# Patient Record
Sex: Male | Born: 1940 | Race: White | Hispanic: No | Marital: Married | Smoking: Never smoker
Health system: Southern US, Community
[De-identification: ages and names within clinical notes are randomized; demographics above are authoritative.]

## PROBLEM LIST (undated history)

## (undated) DIAGNOSIS — D759 Disease of blood and blood-forming organs, unspecified: Secondary | ICD-10-CM

## (undated) DIAGNOSIS — J189 Pneumonia, unspecified organism: Secondary | ICD-10-CM

## (undated) DIAGNOSIS — M1990 Unspecified osteoarthritis, unspecified site: Secondary | ICD-10-CM

## (undated) DIAGNOSIS — S82209A Unspecified fracture of shaft of unspecified tibia, initial encounter for closed fracture: Secondary | ICD-10-CM

## (undated) DIAGNOSIS — I10 Essential (primary) hypertension: Secondary | ICD-10-CM

## (undated) DIAGNOSIS — I82409 Acute embolism and thrombosis of unspecified deep veins of unspecified lower extremity: Secondary | ICD-10-CM

## (undated) DIAGNOSIS — IMO0001 Reserved for inherently not codable concepts without codable children: Secondary | ICD-10-CM

## (undated) DIAGNOSIS — I739 Peripheral vascular disease, unspecified: Secondary | ICD-10-CM

## (undated) DIAGNOSIS — E039 Hypothyroidism, unspecified: Secondary | ICD-10-CM

## (undated) HISTORY — PX: CORONARY ARTERY BYPASS GRAFT: SHX141

## (undated) HISTORY — PX: HYDROCELE EXCISION: SHX482

## (undated) HISTORY — PX: OTHER SURGICAL HISTORY: SHX169

---

## 1962-01-17 HISTORY — PX: OTHER SURGICAL HISTORY: SHX169

## 2009-04-27 ENCOUNTER — Ambulatory Visit: Payer: Self-pay | Admitting: Cardiology

## 2011-02-01 DIAGNOSIS — I82409 Acute embolism and thrombosis of unspecified deep veins of unspecified lower extremity: Secondary | ICD-10-CM | POA: Diagnosis not present

## 2011-02-22 DIAGNOSIS — E785 Hyperlipidemia, unspecified: Secondary | ICD-10-CM | POA: Diagnosis not present

## 2011-02-22 DIAGNOSIS — I10 Essential (primary) hypertension: Secondary | ICD-10-CM | POA: Diagnosis not present

## 2011-02-22 DIAGNOSIS — N400 Enlarged prostate without lower urinary tract symptoms: Secondary | ICD-10-CM | POA: Diagnosis not present

## 2011-03-01 DIAGNOSIS — M1990 Unspecified osteoarthritis, unspecified site: Secondary | ICD-10-CM | POA: Diagnosis not present

## 2011-03-01 DIAGNOSIS — I82409 Acute embolism and thrombosis of unspecified deep veins of unspecified lower extremity: Secondary | ICD-10-CM | POA: Diagnosis not present

## 2011-03-01 DIAGNOSIS — Z0000 Encounter for general adult medical examination without abnormal findings: Secondary | ICD-10-CM | POA: Diagnosis not present

## 2011-03-01 DIAGNOSIS — D510 Vitamin B12 deficiency anemia due to intrinsic factor deficiency: Secondary | ICD-10-CM | POA: Diagnosis not present

## 2011-03-01 DIAGNOSIS — I10 Essential (primary) hypertension: Secondary | ICD-10-CM | POA: Diagnosis not present

## 2011-03-01 DIAGNOSIS — E669 Obesity, unspecified: Secondary | ICD-10-CM | POA: Diagnosis not present

## 2011-03-01 DIAGNOSIS — Z23 Encounter for immunization: Secondary | ICD-10-CM | POA: Diagnosis not present

## 2011-03-01 DIAGNOSIS — G622 Polyneuropathy due to other toxic agents: Secondary | ICD-10-CM | POA: Diagnosis not present

## 2011-03-01 DIAGNOSIS — R5381 Other malaise: Secondary | ICD-10-CM | POA: Diagnosis not present

## 2011-03-01 DIAGNOSIS — E782 Mixed hyperlipidemia: Secondary | ICD-10-CM | POA: Diagnosis not present

## 2011-03-29 DIAGNOSIS — I82409 Acute embolism and thrombosis of unspecified deep veins of unspecified lower extremity: Secondary | ICD-10-CM | POA: Diagnosis not present

## 2011-04-22 DIAGNOSIS — E039 Hypothyroidism, unspecified: Secondary | ICD-10-CM | POA: Diagnosis not present

## 2011-04-29 DIAGNOSIS — I82409 Acute embolism and thrombosis of unspecified deep veins of unspecified lower extremity: Secondary | ICD-10-CM | POA: Diagnosis not present

## 2011-06-15 DIAGNOSIS — I82409 Acute embolism and thrombosis of unspecified deep veins of unspecified lower extremity: Secondary | ICD-10-CM | POA: Diagnosis not present

## 2011-06-15 DIAGNOSIS — E039 Hypothyroidism, unspecified: Secondary | ICD-10-CM | POA: Diagnosis not present

## 2011-07-14 DIAGNOSIS — I82409 Acute embolism and thrombosis of unspecified deep veins of unspecified lower extremity: Secondary | ICD-10-CM | POA: Diagnosis not present

## 2011-08-10 DIAGNOSIS — I82409 Acute embolism and thrombosis of unspecified deep veins of unspecified lower extremity: Secondary | ICD-10-CM | POA: Diagnosis not present

## 2011-08-25 DIAGNOSIS — H2510 Age-related nuclear cataract, unspecified eye: Secondary | ICD-10-CM | POA: Diagnosis not present

## 2011-08-25 DIAGNOSIS — I10 Essential (primary) hypertension: Secondary | ICD-10-CM | POA: Diagnosis not present

## 2011-08-25 DIAGNOSIS — E039 Hypothyroidism, unspecified: Secondary | ICD-10-CM | POA: Diagnosis not present

## 2011-08-25 DIAGNOSIS — E782 Mixed hyperlipidemia: Secondary | ICD-10-CM | POA: Diagnosis not present

## 2011-09-01 DIAGNOSIS — E039 Hypothyroidism, unspecified: Secondary | ICD-10-CM | POA: Diagnosis not present

## 2011-09-01 DIAGNOSIS — M1990 Unspecified osteoarthritis, unspecified site: Secondary | ICD-10-CM | POA: Diagnosis not present

## 2011-09-01 DIAGNOSIS — G619 Inflammatory polyneuropathy, unspecified: Secondary | ICD-10-CM | POA: Diagnosis not present

## 2011-09-01 DIAGNOSIS — G622 Polyneuropathy due to other toxic agents: Secondary | ICD-10-CM | POA: Diagnosis not present

## 2011-09-01 DIAGNOSIS — I82409 Acute embolism and thrombosis of unspecified deep veins of unspecified lower extremity: Secondary | ICD-10-CM | POA: Diagnosis not present

## 2011-09-01 DIAGNOSIS — IMO0001 Reserved for inherently not codable concepts without codable children: Secondary | ICD-10-CM | POA: Diagnosis not present

## 2011-09-01 DIAGNOSIS — E669 Obesity, unspecified: Secondary | ICD-10-CM | POA: Diagnosis not present

## 2011-09-01 DIAGNOSIS — E782 Mixed hyperlipidemia: Secondary | ICD-10-CM | POA: Diagnosis not present

## 2011-09-01 DIAGNOSIS — I10 Essential (primary) hypertension: Secondary | ICD-10-CM | POA: Diagnosis not present

## 2011-09-14 DIAGNOSIS — I82409 Acute embolism and thrombosis of unspecified deep veins of unspecified lower extremity: Secondary | ICD-10-CM | POA: Diagnosis not present

## 2011-10-12 DIAGNOSIS — Z23 Encounter for immunization: Secondary | ICD-10-CM | POA: Diagnosis not present

## 2011-10-26 DIAGNOSIS — I82409 Acute embolism and thrombosis of unspecified deep veins of unspecified lower extremity: Secondary | ICD-10-CM | POA: Diagnosis not present

## 2011-12-07 DIAGNOSIS — I82409 Acute embolism and thrombosis of unspecified deep veins of unspecified lower extremity: Secondary | ICD-10-CM | POA: Diagnosis not present

## 2012-01-20 DIAGNOSIS — I82409 Acute embolism and thrombosis of unspecified deep veins of unspecified lower extremity: Secondary | ICD-10-CM | POA: Diagnosis not present

## 2012-03-07 DIAGNOSIS — N400 Enlarged prostate without lower urinary tract symptoms: Secondary | ICD-10-CM | POA: Diagnosis not present

## 2012-03-07 DIAGNOSIS — E782 Mixed hyperlipidemia: Secondary | ICD-10-CM | POA: Diagnosis not present

## 2012-03-14 DIAGNOSIS — Z0000 Encounter for general adult medical examination without abnormal findings: Secondary | ICD-10-CM | POA: Diagnosis not present

## 2012-03-14 DIAGNOSIS — I10 Essential (primary) hypertension: Secondary | ICD-10-CM | POA: Diagnosis not present

## 2012-04-20 DIAGNOSIS — I82409 Acute embolism and thrombosis of unspecified deep veins of unspecified lower extremity: Secondary | ICD-10-CM | POA: Diagnosis not present

## 2012-05-28 DIAGNOSIS — I82409 Acute embolism and thrombosis of unspecified deep veins of unspecified lower extremity: Secondary | ICD-10-CM | POA: Diagnosis not present

## 2012-07-06 DIAGNOSIS — I82409 Acute embolism and thrombosis of unspecified deep veins of unspecified lower extremity: Secondary | ICD-10-CM | POA: Diagnosis not present

## 2012-07-23 DIAGNOSIS — I10 Essential (primary) hypertension: Secondary | ICD-10-CM | POA: Diagnosis not present

## 2012-07-23 DIAGNOSIS — M1990 Unspecified osteoarthritis, unspecified site: Secondary | ICD-10-CM | POA: Diagnosis not present

## 2012-07-23 DIAGNOSIS — IMO0001 Reserved for inherently not codable concepts without codable children: Secondary | ICD-10-CM | POA: Diagnosis not present

## 2012-07-23 DIAGNOSIS — E782 Mixed hyperlipidemia: Secondary | ICD-10-CM | POA: Diagnosis not present

## 2012-07-23 DIAGNOSIS — E039 Hypothyroidism, unspecified: Secondary | ICD-10-CM | POA: Diagnosis not present

## 2012-07-23 DIAGNOSIS — G619 Inflammatory polyneuropathy, unspecified: Secondary | ICD-10-CM | POA: Diagnosis not present

## 2012-07-23 DIAGNOSIS — G622 Polyneuropathy due to other toxic agents: Secondary | ICD-10-CM | POA: Diagnosis not present

## 2012-08-10 DIAGNOSIS — I82409 Acute embolism and thrombosis of unspecified deep veins of unspecified lower extremity: Secondary | ICD-10-CM | POA: Diagnosis not present

## 2012-09-21 DIAGNOSIS — I82409 Acute embolism and thrombosis of unspecified deep veins of unspecified lower extremity: Secondary | ICD-10-CM | POA: Diagnosis not present

## 2012-09-28 DIAGNOSIS — H2510 Age-related nuclear cataract, unspecified eye: Secondary | ICD-10-CM | POA: Diagnosis not present

## 2012-10-16 DIAGNOSIS — Z23 Encounter for immunization: Secondary | ICD-10-CM | POA: Diagnosis not present

## 2012-10-18 DIAGNOSIS — J019 Acute sinusitis, unspecified: Secondary | ICD-10-CM | POA: Diagnosis not present

## 2012-10-18 DIAGNOSIS — J309 Allergic rhinitis, unspecified: Secondary | ICD-10-CM | POA: Diagnosis not present

## 2012-11-02 DIAGNOSIS — I82409 Acute embolism and thrombosis of unspecified deep veins of unspecified lower extremity: Secondary | ICD-10-CM | POA: Diagnosis not present

## 2012-11-21 DIAGNOSIS — IMO0002 Reserved for concepts with insufficient information to code with codable children: Secondary | ICD-10-CM | POA: Diagnosis not present

## 2012-11-21 DIAGNOSIS — S61209A Unspecified open wound of unspecified finger without damage to nail, initial encounter: Secondary | ICD-10-CM | POA: Diagnosis not present

## 2012-11-21 DIAGNOSIS — S62639B Displaced fracture of distal phalanx of unspecified finger, initial encounter for open fracture: Secondary | ICD-10-CM | POA: Diagnosis not present

## 2012-12-03 DIAGNOSIS — S61209A Unspecified open wound of unspecified finger without damage to nail, initial encounter: Secondary | ICD-10-CM | POA: Diagnosis not present

## 2012-12-10 DIAGNOSIS — S61209A Unspecified open wound of unspecified finger without damage to nail, initial encounter: Secondary | ICD-10-CM | POA: Diagnosis not present

## 2012-12-10 DIAGNOSIS — I82409 Acute embolism and thrombosis of unspecified deep veins of unspecified lower extremity: Secondary | ICD-10-CM | POA: Diagnosis not present

## 2012-12-28 DIAGNOSIS — R05 Cough: Secondary | ICD-10-CM | POA: Diagnosis not present

## 2012-12-28 DIAGNOSIS — J40 Bronchitis, not specified as acute or chronic: Secondary | ICD-10-CM | POA: Diagnosis not present

## 2013-01-08 DIAGNOSIS — I82409 Acute embolism and thrombosis of unspecified deep veins of unspecified lower extremity: Secondary | ICD-10-CM | POA: Diagnosis not present

## 2013-01-08 DIAGNOSIS — J40 Bronchitis, not specified as acute or chronic: Secondary | ICD-10-CM | POA: Diagnosis not present

## 2013-01-08 DIAGNOSIS — R05 Cough: Secondary | ICD-10-CM | POA: Diagnosis not present

## 2013-01-15 DIAGNOSIS — I82409 Acute embolism and thrombosis of unspecified deep veins of unspecified lower extremity: Secondary | ICD-10-CM | POA: Diagnosis not present

## 2013-02-26 DIAGNOSIS — I82409 Acute embolism and thrombosis of unspecified deep veins of unspecified lower extremity: Secondary | ICD-10-CM | POA: Diagnosis not present

## 2013-03-06 DIAGNOSIS — M1990 Unspecified osteoarthritis, unspecified site: Secondary | ICD-10-CM | POA: Diagnosis not present

## 2013-03-06 DIAGNOSIS — I10 Essential (primary) hypertension: Secondary | ICD-10-CM | POA: Diagnosis not present

## 2013-03-06 DIAGNOSIS — E669 Obesity, unspecified: Secondary | ICD-10-CM | POA: Diagnosis not present

## 2013-03-06 DIAGNOSIS — E039 Hypothyroidism, unspecified: Secondary | ICD-10-CM | POA: Diagnosis not present

## 2013-03-06 DIAGNOSIS — IMO0001 Reserved for inherently not codable concepts without codable children: Secondary | ICD-10-CM | POA: Diagnosis not present

## 2013-03-06 DIAGNOSIS — I82409 Acute embolism and thrombosis of unspecified deep veins of unspecified lower extremity: Secondary | ICD-10-CM | POA: Diagnosis not present

## 2013-03-06 DIAGNOSIS — E119 Type 2 diabetes mellitus without complications: Secondary | ICD-10-CM | POA: Diagnosis not present

## 2013-03-06 DIAGNOSIS — E782 Mixed hyperlipidemia: Secondary | ICD-10-CM | POA: Diagnosis not present

## 2013-03-13 DIAGNOSIS — I10 Essential (primary) hypertension: Secondary | ICD-10-CM | POA: Diagnosis not present

## 2013-03-13 DIAGNOSIS — E039 Hypothyroidism, unspecified: Secondary | ICD-10-CM | POA: Diagnosis not present

## 2013-03-13 DIAGNOSIS — E119 Type 2 diabetes mellitus without complications: Secondary | ICD-10-CM | POA: Diagnosis not present

## 2013-03-13 DIAGNOSIS — G622 Polyneuropathy due to other toxic agents: Secondary | ICD-10-CM | POA: Diagnosis not present

## 2013-03-13 DIAGNOSIS — M1990 Unspecified osteoarthritis, unspecified site: Secondary | ICD-10-CM | POA: Diagnosis not present

## 2013-03-13 DIAGNOSIS — E782 Mixed hyperlipidemia: Secondary | ICD-10-CM | POA: Diagnosis not present

## 2013-03-13 DIAGNOSIS — Z0000 Encounter for general adult medical examination without abnormal findings: Secondary | ICD-10-CM | POA: Diagnosis not present

## 2013-03-13 DIAGNOSIS — IMO0001 Reserved for inherently not codable concepts without codable children: Secondary | ICD-10-CM | POA: Diagnosis not present

## 2013-03-13 DIAGNOSIS — G619 Inflammatory polyneuropathy, unspecified: Secondary | ICD-10-CM | POA: Diagnosis not present

## 2013-04-09 DIAGNOSIS — I82409 Acute embolism and thrombosis of unspecified deep veins of unspecified lower extremity: Secondary | ICD-10-CM | POA: Diagnosis not present

## 2013-05-21 DIAGNOSIS — I82409 Acute embolism and thrombosis of unspecified deep veins of unspecified lower extremity: Secondary | ICD-10-CM | POA: Diagnosis not present

## 2013-05-23 DIAGNOSIS — M1710 Unilateral primary osteoarthritis, unspecified knee: Secondary | ICD-10-CM | POA: Diagnosis not present

## 2013-07-04 DIAGNOSIS — I82409 Acute embolism and thrombosis of unspecified deep veins of unspecified lower extremity: Secondary | ICD-10-CM | POA: Diagnosis not present

## 2013-07-15 DIAGNOSIS — G622 Polyneuropathy due to other toxic agents: Secondary | ICD-10-CM | POA: Diagnosis not present

## 2013-07-15 DIAGNOSIS — G619 Inflammatory polyneuropathy, unspecified: Secondary | ICD-10-CM | POA: Diagnosis not present

## 2013-07-15 DIAGNOSIS — E8881 Metabolic syndrome: Secondary | ICD-10-CM | POA: Diagnosis not present

## 2013-07-15 DIAGNOSIS — E782 Mixed hyperlipidemia: Secondary | ICD-10-CM | POA: Diagnosis not present

## 2013-07-15 DIAGNOSIS — E119 Type 2 diabetes mellitus without complications: Secondary | ICD-10-CM | POA: Diagnosis not present

## 2013-07-15 DIAGNOSIS — IMO0001 Reserved for inherently not codable concepts without codable children: Secondary | ICD-10-CM | POA: Diagnosis not present

## 2013-07-15 DIAGNOSIS — E039 Hypothyroidism, unspecified: Secondary | ICD-10-CM | POA: Diagnosis not present

## 2013-07-15 DIAGNOSIS — I10 Essential (primary) hypertension: Secondary | ICD-10-CM | POA: Diagnosis not present

## 2013-07-16 DIAGNOSIS — M1710 Unilateral primary osteoarthritis, unspecified knee: Secondary | ICD-10-CM | POA: Diagnosis not present

## 2013-07-22 DIAGNOSIS — M1990 Unspecified osteoarthritis, unspecified site: Secondary | ICD-10-CM | POA: Diagnosis not present

## 2013-07-22 DIAGNOSIS — IMO0001 Reserved for inherently not codable concepts without codable children: Secondary | ICD-10-CM | POA: Diagnosis not present

## 2013-07-22 DIAGNOSIS — E039 Hypothyroidism, unspecified: Secondary | ICD-10-CM | POA: Diagnosis not present

## 2013-07-22 DIAGNOSIS — G619 Inflammatory polyneuropathy, unspecified: Secondary | ICD-10-CM | POA: Diagnosis not present

## 2013-07-22 DIAGNOSIS — E119 Type 2 diabetes mellitus without complications: Secondary | ICD-10-CM | POA: Diagnosis not present

## 2013-07-22 DIAGNOSIS — I82409 Acute embolism and thrombosis of unspecified deep veins of unspecified lower extremity: Secondary | ICD-10-CM | POA: Diagnosis not present

## 2013-07-22 DIAGNOSIS — E782 Mixed hyperlipidemia: Secondary | ICD-10-CM | POA: Diagnosis not present

## 2013-07-22 DIAGNOSIS — I10 Essential (primary) hypertension: Secondary | ICD-10-CM | POA: Diagnosis not present

## 2013-08-15 DIAGNOSIS — I82409 Acute embolism and thrombosis of unspecified deep veins of unspecified lower extremity: Secondary | ICD-10-CM | POA: Diagnosis not present

## 2013-09-19 ENCOUNTER — Other Ambulatory Visit: Payer: Self-pay | Admitting: Orthopedic Surgery

## 2013-09-19 NOTE — Progress Notes (Signed)
Preoperative surgical orders have been place into the Epic hospital system for [facility] on 09/19/2013, 1:17 PM  by Mickel Crow for surgery on 10/21/2013.  Preop Total Knee orders including Experal, IV Tylenol, and IV Decadron as long as there are no contraindications to the above medications. Arlee Muslim, PA-C

## 2013-09-26 DIAGNOSIS — I82409 Acute embolism and thrombosis of unspecified deep veins of unspecified lower extremity: Secondary | ICD-10-CM | POA: Diagnosis not present

## 2013-10-04 ENCOUNTER — Other Ambulatory Visit: Payer: Self-pay | Admitting: Orthopedic Surgery

## 2013-10-04 ENCOUNTER — Encounter (HOSPITAL_COMMUNITY): Payer: Self-pay | Admitting: Pharmacy Technician

## 2013-10-04 NOTE — H&P (Signed)
[name] [name] DOB: 12/27/1940 Married / Language: English / Race: White Male Date of Admission:  10-21-2013 Chief Complaint:  Right Knee Pain History of Present Illness The patient is a 73 year old male who comes in for a preoperative History and Physical. The patient is scheduled for a right total knee arthroplasty to be performed by Dr. Dione Plover. Aluisio, MD at [facility] on 10/21/2013. The patient is a 73 year old male who presents for follow up of their knee. The patient is being followed for their bilateral knee pain. They are now several months out from cortisone injections. Symptoms reported today include: pain. The patient feels that they are doing poorly (Patient states that the injections only helped for a couple hours. ). The following medication has been used for pain control: Norco. Unfortunately, the cortisone was minimally beneficial. He states that the right knee is bothering him currently more than the left. He has documented significant arthritis bone on bone medial and patellofemoral both knees. He feels as though the knee is preventing him from doing things that he desires. He would like to proceed with surgery at this time. They have been treated conservatively in the past for the above stated problem and despite conservative measures, they continue to have progressive pain and severe functional limitations and dysfunction. They have failed non-operative management including home exercise, medications, and injections. It is felt that they would benefit from undergoing total joint replacement. Risks and benefits of the procedure have been discussed with the patient and they elect to proceed with surgery. There are no active contraindications to surgery such as ongoing infection or rapidly progressive neurological disease.  Allergies Sulfanilamide *CHEMICALS* Hives. Blisters ACE Inhibitors Cough. 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) Impaired Vision Impaired Memory Tinnitus Bronchitis Past History Pneumonia Past History Hypertension Coronary Artery Disease/Heart Disease Hypercholesterolemia Phlebitis Deep vein thrombosis Pulmonary Embolism Kidney Stone History of Left Tibia Fracture 1970 Measles Mumps  Social History Alcohol use never consumed alcohol Current work status retired Illicit drug use no [facility] Drug/Alcohol Rehab (Previously) no Drug/Alcohol Rehab (Currently) no Marital status married Tobacco use never smoker Tobacco / smoke exposure no Living situation live with spouse Pain Contract no Post-Surgical Plans Home Advance Directives Living Will, Healthcare POA  Medication History Zetia (10MG  Tablet, Oral) Active. Coumadin (5MG  Tablet, Oral) Active. Toprol XL (50MG  Tablet ER, Oral) Active. Aspirin (325MG  Tablet, Oral) Active. Cozaar (100MG  Tablet, Oral) Active. Viagra (100MG  Tablet, Oral) Active. (prn) Thyrox (50MCG Tablet, Oral) Active. Ultram (50MG  Tablet, Oral) Active. Fish Oil Active. Multivitamin Active.  Past Surgical History  Ankle Surgery Date: 64. left CABG Date: 1995. Circumcision Date: 1964. Hydrocele Date: 46.  Review of Systems General Not Present- Chills, Fatigue, Fever, Memory Loss, Night Sweats, Weight Gain and Weight Loss. Skin Not Present- Eczema, Hives, Itching, Lesions and Rash. HEENT Not Present- Dentures, Double Vision, Headache, Hearing Loss, Tinnitus and Visual Loss. Respiratory Not Present- Allergies, Chronic Cough, Coughing up blood, Shortness of breath at rest and Shortness of breath with exertion. Cardiovascular Not Present- Chest Pain, Difficulty Breathing Lying Down, Murmur, Palpitations, Racing/skipping heartbeats and  Swelling. Gastrointestinal Not Present- Abdominal Pain, Bloody Stool, Constipation, Diarrhea, Difficulty Swallowing, Heartburn, Jaundice, Loss of appetitie, Nausea and Vomiting. Male Genitourinary Not Present- Blood in Urine, Discharge, Flank Pain, Incontinence, Painful Urination, Urgency, Urinary frequency, Urinary Retention, Urinating at  Night and Weak urinary stream. Musculoskeletal Not Present- Back Pain, Joint Pain, Joint Swelling, Morning Stiffness, Muscle Pain, Muscle Weakness and Spasms. Neurological Not Present- Blackout spells, Difficulty with balance, Dizziness, Paralysis, Tremor and Weakness. Psychiatric Not Present- Insomnia.  Vitals Weight: 248 lb Height: 68in Body Surface Area: 2.32 m Body Mass Index: 37.71 kg/m Pulse: 56 (Regular)  Resp.: 16 (Unlabored)  BP: 148/90 (Sitting, Right Arm, Standard)  Physical Exam General Mental Status -Alert, cooperative and good historian. General Appearance-pleasant, Not in acute distress. Orientation-Oriented X3. Build & Nutrition-Well nourished and Well developed.  Head and Neck Head-normocephalic, atraumatic . Neck Global Assessment - supple, no bruit auscultated on the right, no bruit auscultated on the left.  Eye Vision-Wears corrective lenses. Pupil - Bilateral-Regular and Round. Motion - Bilateral-EOMI.  ENMT Note: bilateral hearing aids   Chest and Lung Exam Auscultation Breath sounds - clear at anterior chest wall and clear at posterior chest wall. Adventitious sounds - No Adventitious sounds.  Cardiovascular Auscultation Rhythm - Regular rate and rhythm. Heart Sounds - S1 WNL and S2 WNL. Murmurs & Other Heart Sounds: Murmur 1 - Location - Aortic Area and Pulmonic Area. Timing - Holosystolic. Grade - II/VI. Character - Low pitched.  Abdomen Inspection Contour - Generalized moderate distention. Palpation/Percussion Tenderness - Abdomen is non-tender to palpation. Rigidity (guarding) -  Abdomen is soft. Auscultation Auscultation of the abdomen reveals - Bowel sounds normal.  Male Genitourinary Note: Not done, not pertinent to present illness   Musculoskeletal Note: On exam, he is alert and oriented in no apparent distress. His hips show normal range of motion with no discomfort. His right knee shows no effusion. Range of motion is about 5-125. The left knee no effusion. Varus deformity. Range is about 5-125 on both knees. He is tender medial greater than lateral with no instability noted.  RADIOGRAPHS: Radiographs are reviewed. AP both knees and lateral of both show that he has advanced arthritic change in both in the medial and patellofemoral compartments.  Assessment & Plan Osteoarthritis of right knee (715.96  M17.9) Current Plans Started Lovenox 40MG /0.4ML, 1 (one) Solution daily, 4 Syringe Sub-Q injections daily for four days prior to surgery. Take each morning on Oct. 1st through Oct. 4th  Note:Plan is for a Right Total Knee Replacement by Dr. Wynelle Link.  Plan is to go home.  PCP - Dr. Gar Ponto - Patient has been seen preoperatively and felt to be stable for surgery.  The patient will receive topical TXA (tranexamic acid) due to: CAD, DVT, PE  Please note that the patient was instructed to stop his Coumadin four days prior to surgery on Sept 30th. He will use Lovenox Bridge injections for four days prior to surgery. He will be placed back onto Coumadin postop along with Lovenox bridging until his Coumadin is back into a therapeutic range.   Signed electronically by Joelene Millin, III PA-C

## 2013-10-07 DIAGNOSIS — Z23 Encounter for immunization: Secondary | ICD-10-CM | POA: Diagnosis not present

## 2013-10-11 ENCOUNTER — Other Ambulatory Visit (HOSPITAL_COMMUNITY): Payer: Self-pay | Admitting: *Deleted

## 2013-10-11 NOTE — Patient Instructions (Addendum)
20     Your procedure is scheduled on:  Monday 10/21/2013  Report to [facility] Main Entrance and follow signs to Short Stay  at  0520 AM.  Call this number if you have problems the night before or morning of surgery: [phone]   Remember:          Do not eat food or drink liquids AFTER MIDNIGHT!  Take these medicines the morning of surgery with A SIP OF WATER: Metoprolol, Levothyroxine    [facility] IS NOT RESPONSIBLE FOR ANY BELONGINGS OR VALUABLES BROUGHT TO HOSPITAL.  Marland Kitchen  Leave suitcase in the car. After surgery it may be brought to your room.  For patients admitted to the hospital, checkout time is 11:00 AM the day of              Discharge.    DO NOT WEAR JEWELRY,MAKE-UP,LOTIONS,POWDERS,PERFUMES,CONTACTS , DENTURES OR BRIDGEWORK ,AND DO NOT WEAR FALSE EYELASHES                                    Patients discharged the day of surgery will not be allowed to drive home. If going home the same day of surgery, must have someone stay with you first 24 hrs.at home and arrange for someone to drive you home from the [place]: N/A   Special Instructions:              Please read over the following fact sheets that you were given:             1. [place] - Preparing for Surgery Before surgery, you can play an important role.  Because skin is not sterile, your skin needs to be as free of germs as possible.  You can reduce the number of germs on your skin by washing with CHG (chlorahexidine gluconate) soap before surgery.  CHG is an antiseptic cleaner which kills germs and bonds with the skin to continue killing germs even after washing. Please DO NOT use if you have an allergy to CHG or antibacterial soaps.  If your skin becomes reddened/irritated stop using the CHG and inform your nurse when you arrive at  Short Stay. Do not shave (including legs and underarms) for at least 48 hours prior to the first CHG shower.  You may shave your face/neck. Please follow these instructions carefully:  1.  Shower with CHG Soap the night before surgery and the  morning of Surgery.  2.  If you choose to wash your hair, wash your hair first as usual with your  normal  shampoo.  3.  After you shampoo, rinse your hair and body thoroughly to remove the  shampoo.  4.  Use CHG as you would any other liquid soap.  You can apply chg directly  to the skin and wash                       Gently with a scrungie or clean washcloth.  5.  Apply the CHG Soap to your body ONLY FROM THE NECK DOWN.   Do not use on face/ open                           Wound or open sores. Avoid contact with eyes, ears mouth and genitals (private parts).                       Wash face,  Genitals (private parts) with your normal soap.             6.  Wash thoroughly, paying special attention to the area where your surgery  will be performed.  7.  Thoroughly rinse your body with warm water from the neck down.  8.  DO NOT shower/wash with your normal soap after using and rinsing off  the CHG Soap.                9.  Pat yourself dry with a clean towel.            10.  Wear clean pajamas.            11.  Place clean sheets on your bed the night of your first shower and do not  sleep with pets. Day of Surgery : Do not apply any lotions/deodorants the morning of surgery.  Please wear clean clothes to the hospital/surgery center.  FAILURE TO FOLLOW THESE INSTRUCTIONS MAY RESULT IN THE CANCELLATION OF YOUR SURGERY PATIENT SIGNATURE_________________________________  NURSE SIGNATURE__________________________________  ________________________________________________________________________   [name] [name]  An incentive spirometer is a tool that can help keep your lungs clear and active. This tool measures how well you are  filling your lungs with each breath. Taking long deep breaths may help reverse or decrease the chance of developing breathing (pulmonary) problems (especially infection) following:  A long period of time when you are unable to move or be active. BEFORE THE PROCEDURE   If the spirometer includes an indicator to show your best effort, your nurse or respiratory therapist will set it to a desired goal.  If possible, sit up straight or lean slightly forward. Try not to slouch.  Hold the incentive spirometer in an upright position. INSTRUCTIONS FOR USE  1. Sit on the edge of your bed if possible, or sit up as far as you can in bed or on a chair. 2. Hold the incentive spirometer in an upright position. 3. Breathe out normally. 4. Place the mouthpiece in your mouth and seal your lips tightly around it. 5. Breathe in slowly and as deeply as possible, raising the piston or the ball toward the top of the column. 6. Hold your breath for 3-5 seconds or for as long as possible. Allow the piston or ball to fall to the bottom of the column. 7. Remove the mouthpiece from your mouth and breathe out normally. 8. Rest for a few seconds and repeat Steps 1 through 7 at least 10 times every 1-2 hours when you are awake. Take your time and take a few normal breaths between deep breaths. 9. The spirometer may include an indicator to show  your best effort. Use the indicator as a goal to work toward during each repetition. 10. After each set of 10 deep breaths, practice coughing to be sure your lungs are clear. If you have an incision (the cut made at the time of surgery), support your incision when coughing by placing a pillow or rolled up towels firmly against it. Once you are able to get out of bed, walk around indoors and cough well. You may stop using the incentive spirometer when instructed by your caregiver.  RISKS AND COMPLICATIONS  Take your time so you do not get dizzy or light-headed.  If you are in pain,  you may need to take or ask for pain medication before doing incentive spirometry. It is harder to take a deep breath if you are having pain. AFTER USE  Rest and breathe slowly and easily.  It can be helpful to keep track of a log of your progress. Your caregiver can provide you with a simple table to help with this. If you are using the spirometer at home, follow these instructions: [place] IF:   You are having difficultly using the spirometer.  You have trouble using the spirometer as often as instructed.  Your pain medication is not giving enough relief while using the spirometer.  You develop fever of 100.5 F (38.1 C) or higher. SEEK IMMEDIATE MEDICAL CARE IF:   You cough up bloody sputum that had not been present before.  You develop fever of 102 F (38.9 C) or greater.  You develop worsening pain at or near the incision site. MAKE SURE YOU:   Understand these instructions.  Will watch your condition.  Will get help right away if you are not doing well or get worse. Document Released: 05/16/2006 Document Revised: 03/28/2011 Document Reviewed: 07/17/2006 Exit[employer] Patient Information 2014 [employer], [place].   ________________________________________________________________________  WHAT IS A BLOOD TRANSFUSION? Blood Transfusion Information  A transfusion is the replacement of blood or some of its parts. Blood is made up of multiple cells which provide different functions.  Red blood cells carry oxygen and are used for blood loss replacement.  White blood cells fight against infection.  Platelets control bleeding.  Plasma helps clot blood.  Other blood products are available for specialized needs, such as hemophilia or other clotting disorders. BEFORE THE TRANSFUSION  Who gives blood for transfusions?   Healthy volunteers who are fully evaluated to make sure their blood is safe. This is blood bank blood. Transfusion therapy is the safest it has ever  been in the practice of medicine. Before blood is taken from a donor, a complete history is taken to make sure that person has no history of diseases nor engages in risky social behavior (examples are intravenous drug use or sexual activity with multiple partners). The donor's travel history is screened to minimize risk of transmitting infections, such as malaria. The donated blood is tested for signs of infectious diseases, such as HIV and hepatitis. The blood is then tested to be sure it is compatible with you in order to minimize the chance of a transfusion reaction. If you or a relative donates blood, this is often done in anticipation of surgery and is not appropriate for emergency situations. It takes many days to process the donated blood. RISKS AND COMPLICATIONS Although transfusion therapy is very safe and saves many lives, the main dangers of transfusion include:   Getting an infectious disease.  Developing a transfusion reaction. This is an allergic reaction to  something in the blood you were given. Every precaution is taken to prevent this. The decision to have a blood transfusion has been considered carefully by your caregiver before blood is given. Blood is not given unless the benefits outweigh the risks. AFTER THE TRANSFUSION  Right after receiving a blood transfusion, you will usually feel much better and more energetic. This is especially true if your red blood cells have gotten low (anemic). The transfusion raises the level of the red blood cells which carry oxygen, and this usually causes an energy increase.  The nurse administering the transfusion will monitor you carefully for complications. HOME CARE INSTRUCTIONS  No special instructions are needed after a transfusion. You may find your energy is better. Speak with your caregiver about any limitations on activity for underlying diseases you may have. SEEK MEDICAL CARE IF:   Your condition is not improving after your  transfusion.  You develop redness or irritation at the intravenous (IV) site. SEEK IMMEDIATE MEDICAL CARE IF:  Any of the following symptoms occur over the next 12 hours:  Shaking chills.  You have a temperature by mouth above 102 F (38.9 C), not controlled by medicine.  Chest, back, or muscle pain.  People around you feel you are not acting correctly or are confused.  Shortness of breath or difficulty breathing.  Dizziness and fainting.  You get a rash or develop hives.  You have a decrease in urine output.  Your urine turns a dark color or changes to pink, red, or brown. Any of the following symptoms occur over the next 10 days:  You have a temperature by mouth above 102 F (38.9 C), not controlled by medicine.  Shortness of breath.  Weakness after normal activity.  The white part of the eye turns yellow (jaundice).  You have a decrease in the amount of urine or are urinating less often.  Your urine turns a dark color or changes to pink, red, or brown. Document Released: 01/01/2000 Document Revised: 03/28/2011 Document Reviewed: 08/20/2007 [facility] Patient Information 2014 [place], [place].  _______________________________________________________________________

## 2013-10-14 ENCOUNTER — Ambulatory Visit (HOSPITAL_COMMUNITY)
Admission: RE | Admit: 2013-10-14 | Discharge: 2013-10-14 | Disposition: A | Discharge status: Home / Self Care | Payer: Medicare Other | Source: Ambulatory Visit | Attending: Anesthesiology | Admitting: Anesthesiology

## 2013-10-14 ENCOUNTER — Encounter (HOSPITAL_COMMUNITY): Payer: Self-pay

## 2013-10-14 ENCOUNTER — Encounter (HOSPITAL_COMMUNITY)
Admission: RE | Admit: 2013-10-14 | Discharge: 2013-10-14 | Disposition: A | Discharge status: Home / Self Care | Payer: Medicare Other | Source: Ambulatory Visit | Attending: Orthopedic Surgery | Admitting: Orthopedic Surgery

## 2013-10-14 ENCOUNTER — Encounter (INDEPENDENT_AMBULATORY_CARE_PROVIDER_SITE_OTHER): Payer: Self-pay

## 2013-10-14 DIAGNOSIS — Z7982 Long term (current) use of aspirin: Secondary | ICD-10-CM | POA: Insufficient documentation

## 2013-10-14 DIAGNOSIS — M1710 Unilateral primary osteoarthritis, unspecified knee: Secondary | ICD-10-CM | POA: Diagnosis present

## 2013-10-14 DIAGNOSIS — I10 Essential (primary) hypertension: Secondary | ICD-10-CM | POA: Diagnosis not present

## 2013-10-14 DIAGNOSIS — Z01812 Encounter for preprocedural laboratory examination: Secondary | ICD-10-CM | POA: Diagnosis not present

## 2013-10-14 DIAGNOSIS — Z79899 Other long term (current) drug therapy: Secondary | ICD-10-CM | POA: Insufficient documentation

## 2013-10-14 DIAGNOSIS — Z01818 Encounter for other preprocedural examination: Secondary | ICD-10-CM | POA: Diagnosis not present

## 2013-10-14 DIAGNOSIS — Z01810 Encounter for preprocedural cardiovascular examination: Secondary | ICD-10-CM | POA: Insufficient documentation

## 2013-10-14 DIAGNOSIS — Z7901 Long term (current) use of anticoagulants: Secondary | ICD-10-CM | POA: Diagnosis not present

## 2013-10-14 HISTORY — DX: Disease of blood and blood-forming organs, unspecified: D75.9

## 2013-10-14 HISTORY — DX: Unspecified osteoarthritis, unspecified site: M19.90

## 2013-10-14 HISTORY — DX: Peripheral vascular disease, unspecified: I73.9

## 2013-10-14 HISTORY — DX: Essential (primary) hypertension: I10

## 2013-10-14 HISTORY — DX: Unspecified fracture of shaft of unspecified tibia, initial encounter for closed fracture: S82.209A

## 2013-10-14 HISTORY — DX: Acute embolism and thrombosis of unspecified deep veins of unspecified lower extremity: I82.409

## 2013-10-14 LAB — CBC
HCT: 45.7 % (ref 39.0–52.0)
HEMOGLOBIN: 15.4 g/dL (ref 13.0–17.0)
MCH: 31.6 pg (ref 26.0–34.0)
MCHC: 33.7 g/dL (ref 30.0–36.0)
MCV: 93.8 fL (ref 78.0–100.0)
Platelets: 194 10*3/uL (ref 150–400)
RBC: 4.87 MIL/uL (ref 4.22–5.81)
RDW: 13.7 % (ref 11.5–15.5)
WBC: 9.8 10*3/uL (ref 4.0–10.5)

## 2013-10-14 LAB — COMPREHENSIVE METABOLIC PANEL
ALK PHOS: 55 U/L (ref 39–117)
ALT: 28 U/L (ref 0–53)
AST: 26 U/L (ref 0–37)
Albumin: 3.6 g/dL (ref 3.5–5.2)
Anion gap: 12 (ref 5–15)
BUN: 24 mg/dL — ABNORMAL HIGH (ref 6–23)
CHLORIDE: 101 meq/L (ref 96–112)
CO2: 25 meq/L (ref 19–32)
Calcium: 9.7 mg/dL (ref 8.4–10.5)
Creatinine, Ser: 1.29 mg/dL (ref 0.50–1.35)
GFR calc Af Amer: 62 mL/min — ABNORMAL LOW (ref >90)
GFR calc non Af Amer: 53 mL/min — ABNORMAL LOW (ref >90)
Glucose, Bld: 90 mg/dL (ref 70–99)
Potassium: 4.8 mEq/L (ref 3.7–5.3)
SODIUM: 138 meq/L (ref 137–147)
Total Bilirubin: 0.5 mg/dL (ref 0.3–1.2)
Total Protein: 7.6 g/dL (ref 6.0–8.3)

## 2013-10-14 LAB — URINALYSIS, ROUTINE W REFLEX MICROSCOPIC
Bilirubin Urine: NEGATIVE
GLUCOSE, UA: NEGATIVE mg/dL
Ketones, ur: NEGATIVE mg/dL
Nitrite: NEGATIVE
Protein, ur: NEGATIVE mg/dL
Specific Gravity, Urine: 1.021 (ref 1.005–1.030)
Urobilinogen, UA: 1 mg/dL (ref 0.0–1.0)
pH: 6 (ref 5.0–8.0)

## 2013-10-14 LAB — URINE MICROSCOPIC-ADD ON

## 2013-10-14 LAB — PROTIME-INR
INR: 2.45 — ABNORMAL HIGH (ref 0.00–1.49)
Prothrombin Time: 26.6 seconds — ABNORMAL HIGH (ref 11.6–15.2)

## 2013-10-14 LAB — SURGICAL PCR SCREEN
MRSA, PCR: NEGATIVE
Staphylococcus aureus: NEGATIVE

## 2013-10-14 LAB — APTT: APTT: 51 s — AB (ref 24–37)

## 2013-10-14 LAB — ABO/RH: ABO/RH(D): A POS

## 2013-10-14 NOTE — Progress Notes (Signed)
10/14/13 1440  OBSTRUCTIVE SLEEP APNEA  Have you ever been diagnosed with sleep apnea through a sleep study? No  Do you snore loudly (loud enough to be heard through closed doors)?  0  Do you often feel tired, fatigued, or sleepy during the daytime? 0  Has anyone observed you stop breathing during your sleep? 0  Do you have, or are you being treated for high blood pressure? 1  BMI more than 35 kg/m2? 1  Age over 73 years old? 1  Neck circumference greater than 40 cm/16 inches? 1  Gender: 1  Obstructive Sleep Apnea Score 5  Score 4 or greater  Results sent to PCP

## 2013-10-15 ENCOUNTER — Other Ambulatory Visit: Payer: Self-pay | Admitting: Orthopedic Surgery

## 2013-10-15 NOTE — Progress Notes (Signed)
New lab, Repeat Protime/INR, added to be drawn on morning of surgery upon patient's arrival to the hospital prior to surgery on 10/21/2013. Arlee Muslim, PA-C

## 2013-10-16 NOTE — Progress Notes (Signed)
Fax received from Dr. Wynelle Link to repeat INR day of surgery and Faxed note put in chart.

## 2013-10-20 NOTE — Anesthesia Preprocedure Evaluation (Addendum)
Anesthesia Evaluation  Patient identified by MRN, date of birth, ID band Patient awake    Reviewed: Allergy & Precautions, H&P , NPO status , Patient's Chart, lab work & pertinent test results, reviewed documented beta blocker date and time   Airway Mallampati: II TM Distance: >3 FB Neck ROM: full    Dental no notable dental hx.    Pulmonary neg pulmonary ROS,  breath sounds clear to auscultation  Pulmonary exam normal       Cardiovascular Exercise Tolerance: Good hypertension, Pt. on medications and Pt. on home beta blockers + CAD and + CABG Rhythm:regular Rate:Normal     Neuro/Psych negative neurological ROS  negative psych ROS   GI/Hepatic negative GI ROS, Neg liver ROS,   Endo/Other  negative endocrine ROSMorbid obesity  Renal/GU negative Renal ROS  negative genitourinary   Musculoskeletal   Abdominal (+) + obese,   Peds  Hematology negative hematology ROS (+)   Anesthesia Other Findings On coumadin  Reproductive/Obstetrics negative OB ROS                          Anesthesia Physical Anesthesia Plan  ASA: III  Anesthesia Plan: Spinal   Post-op Pain Management:    Induction:   Airway Management Planned: Simple Face Mask  Additional Equipment:   Intra-op Plan:   Post-operative Plan:   Informed Consent: I have reviewed the patients History and Physical, chart, labs and discussed the procedure including the risks, benefits and alternatives for the proposed anesthesia with the patient or authorized representative who has indicated his/her understanding and acceptance.   Dental Advisory Given  Plan Discussed with: CRNA and Surgeon  Anesthesia Plan Comments:        Anesthesia Quick Evaluation

## 2013-10-21 ENCOUNTER — Inpatient Hospital Stay (HOSPITAL_COMMUNITY)
Admission: RE | Admit: 2013-10-21 | Discharge: 2013-10-23 | DRG: 470 | Disposition: A | Discharge status: Home Health | Payer: Medicare Other | Source: Ambulatory Visit | Attending: Orthopedic Surgery | Admitting: Orthopedic Surgery

## 2013-10-21 ENCOUNTER — Encounter (HOSPITAL_COMMUNITY)
Admission: RE | Disposition: A | Discharge status: Home Health | Payer: Self-pay | Source: Ambulatory Visit | Attending: Orthopedic Surgery

## 2013-10-21 ENCOUNTER — Encounter (HOSPITAL_COMMUNITY): Payer: Self-pay | Admitting: *Deleted

## 2013-10-21 ENCOUNTER — Encounter (HOSPITAL_COMMUNITY): Payer: Medicare Other | Admitting: Anesthesiology

## 2013-10-21 ENCOUNTER — Inpatient Hospital Stay (HOSPITAL_COMMUNITY): Payer: Medicare Other | Admitting: Anesthesiology

## 2013-10-21 DIAGNOSIS — D6859 Other primary thrombophilia: Secondary | ICD-10-CM | POA: Diagnosis present

## 2013-10-21 DIAGNOSIS — I10 Essential (primary) hypertension: Secondary | ICD-10-CM | POA: Diagnosis not present

## 2013-10-21 DIAGNOSIS — M1711 Unilateral primary osteoarthritis, right knee: Secondary | ICD-10-CM

## 2013-10-21 DIAGNOSIS — I2510 Atherosclerotic heart disease of native coronary artery without angina pectoris: Secondary | ICD-10-CM | POA: Diagnosis present

## 2013-10-21 DIAGNOSIS — M170 Bilateral primary osteoarthritis of knee: Principal | ICD-10-CM | POA: Diagnosis present

## 2013-10-21 DIAGNOSIS — Z951 Presence of aortocoronary bypass graft: Secondary | ICD-10-CM

## 2013-10-21 DIAGNOSIS — M179 Osteoarthritis of knee, unspecified: Secondary | ICD-10-CM | POA: Diagnosis present

## 2013-10-21 DIAGNOSIS — Z6836 Body mass index (BMI) 36.0-36.9, adult: Secondary | ICD-10-CM

## 2013-10-21 DIAGNOSIS — M1710 Unilateral primary osteoarthritis, unspecified knee: Secondary | ICD-10-CM | POA: Diagnosis present

## 2013-10-21 DIAGNOSIS — M25561 Pain in right knee: Secondary | ICD-10-CM | POA: Diagnosis not present

## 2013-10-21 HISTORY — PX: TOTAL KNEE ARTHROPLASTY: SHX125

## 2013-10-21 LAB — TYPE AND SCREEN
ABO/RH(D): A POS
Antibody Screen: NEGATIVE

## 2013-10-21 LAB — PROTIME-INR
INR: 1.02 (ref 0.00–1.49)
PROTHROMBIN TIME: 13.5 s (ref 11.6–15.2)

## 2013-10-21 SURGERY — ARTHROPLASTY, KNEE, TOTAL
Anesthesia: Spinal | Site: Knee | Laterality: Right

## 2013-10-21 MED ORDER — WARFARIN - PHARMACIST DOSING INPATIENT: Freq: Every day | Status: DC

## 2013-10-21 MED ORDER — WARFARIN SODIUM 7.5 MG PO TABS
7.5000 mg | ORAL_TABLET | Freq: Once | ORAL | Status: AC
  Administered 2013-10-21: 7.5 mg via ORAL
  Filled 2013-10-21: qty 1

## 2013-10-21 MED ORDER — DIPHENHYDRAMINE HCL 12.5 MG/5ML PO ELIX
12.5000 mg | ORAL_SOLUTION | ORAL | Status: DC | PRN
Start: 2013-10-21 — End: 2013-10-23

## 2013-10-21 MED ORDER — PHENYLEPHRINE HCL 10 MG/ML IJ SOLN
INTRAMUSCULAR | Status: DC | PRN
  Administered 2013-10-21 (×2): 80 ug via INTRAVENOUS

## 2013-10-21 MED ORDER — PROPOFOL INFUSION 10 MG/ML OPTIME
INTRAVENOUS | Status: DC | PRN
  Administered 2013-10-21: 60 ug/kg/min via INTRAVENOUS

## 2013-10-21 MED ORDER — FENTANYL CITRATE 0.05 MG/ML IJ SOLN
INTRAMUSCULAR | Status: AC
  Filled 2013-10-21: qty 2

## 2013-10-21 MED ORDER — FLEET ENEMA 7-19 GM/118ML RE ENEM: 1.0000 | ENEMA | Freq: Once | RECTAL | Status: AC | PRN

## 2013-10-21 MED ORDER — TRAMADOL HCL 50 MG PO TABS: 50.0000 mg | ORAL_TABLET | Freq: Four times a day (QID) | ORAL | Status: DC | PRN

## 2013-10-21 MED ORDER — CEFAZOLIN SODIUM-DEXTROSE 2-3 GM-% IV SOLR
2.0000 g | INTRAVENOUS | Status: DC
  Administered 2013-10-21: 2 g via INTRAVENOUS

## 2013-10-21 MED ORDER — METOPROLOL TARTRATE 50 MG PO TABS
50.0000 mg | ORAL_TABLET | Freq: Every morning | ORAL | Status: DC
  Administered 2013-10-22 – 2013-10-23 (×2): 50 mg via ORAL
  Filled 2013-10-21 (×2): qty 1

## 2013-10-21 MED ORDER — METOCLOPRAMIDE HCL 10 MG PO TABS: 5.0000 mg | ORAL_TABLET | Freq: Three times a day (TID) | ORAL | Status: DC | PRN

## 2013-10-21 MED ORDER — FENTANYL CITRATE 0.05 MG/ML IJ SOLN
INTRAMUSCULAR | Status: DC | PRN
  Administered 2013-10-21: 100 ug via INTRAVENOUS

## 2013-10-21 MED ORDER — LACTATED RINGERS IV SOLN: INTRAVENOUS | Status: DC

## 2013-10-21 MED ORDER — PHENOL 1.4 % MT LIQD
1.0000 | OROMUCOSAL | Status: DC | PRN
  Filled 2013-10-21: qty 177

## 2013-10-21 MED ORDER — METHOCARBAMOL 500 MG PO TABS
500.0000 mg | ORAL_TABLET | Freq: Four times a day (QID) | ORAL | Status: DC | PRN
  Administered 2013-10-22 – 2013-10-23 (×5): 500 mg via ORAL
  Filled 2013-10-21 (×5): qty 1

## 2013-10-21 MED ORDER — ACETAMINOPHEN 325 MG PO TABS
650.0000 mg | ORAL_TABLET | Freq: Four times a day (QID) | ORAL | Status: DC | PRN
  Administered 2013-10-23: 650 mg via ORAL
  Filled 2013-10-21: qty 2

## 2013-10-21 MED ORDER — BUPIVACAINE HCL 0.25 % IJ SOLN
INTRAMUSCULAR | Status: DC | PRN
  Administered 2013-10-21: 20 mL

## 2013-10-21 MED ORDER — SODIUM CHLORIDE 0.9 % IJ SOLN
INTRAMUSCULAR | Status: DC | PRN
  Administered 2013-10-21: 30 mL

## 2013-10-21 MED ORDER — LOSARTAN POTASSIUM 50 MG PO TABS
100.0000 mg | ORAL_TABLET | Freq: Every morning | ORAL | Status: DC
  Administered 2013-10-21 – 2013-10-23 (×3): 100 mg via ORAL
  Filled 2013-10-21 (×3): qty 2

## 2013-10-21 MED ORDER — OXYCODONE HCL 5 MG PO TABS
5.0000 mg | ORAL_TABLET | ORAL | Status: DC | PRN
Start: 2013-10-21 — End: 2013-10-23
  Administered 2013-10-21 – 2013-10-23 (×8): 10 mg via ORAL
  Filled 2013-10-21 (×8): qty 2

## 2013-10-21 MED ORDER — POLYETHYLENE GLYCOL 3350 17 G PO PACK: 17.0000 g | PACK | Freq: Every day | ORAL | Status: DC | PRN

## 2013-10-21 MED ORDER — SODIUM CHLORIDE 0.9 % IV SOLN: INTRAVENOUS | Status: DC

## 2013-10-21 MED ORDER — ACETAMINOPHEN 500 MG PO TABS
1000.0000 mg | ORAL_TABLET | Freq: Four times a day (QID) | ORAL | Status: AC
  Administered 2013-10-21 – 2013-10-22 (×4): 1000 mg via ORAL
  Filled 2013-10-21 (×4): qty 2

## 2013-10-21 MED ORDER — CEFAZOLIN SODIUM-DEXTROSE 2-3 GM-% IV SOLR
2.0000 g | Freq: Four times a day (QID) | INTRAVENOUS | Status: AC
  Administered 2013-10-21 (×2): 2 g via INTRAVENOUS
  Filled 2013-10-21 (×2): qty 50

## 2013-10-21 MED ORDER — ENOXAPARIN SODIUM 30 MG/0.3ML ~~LOC~~ SOLN
30.0000 mg | Freq: Two times a day (BID) | SUBCUTANEOUS | Status: DC
  Administered 2013-10-22 – 2013-10-23 (×3): 30 mg via SUBCUTANEOUS
  Filled 2013-10-21 (×6): qty 0.3

## 2013-10-21 MED ORDER — PROPOFOL 10 MG/ML IV BOLUS
INTRAVENOUS | Status: AC
Start: 2013-10-21 — End: 2013-10-21
  Filled 2013-10-21: qty 20

## 2013-10-21 MED ORDER — BISACODYL 10 MG RE SUPP: 10.0000 mg | Freq: Every day | RECTAL | Status: DC | PRN

## 2013-10-21 MED ORDER — HYDROMORPHONE HCL 1 MG/ML IJ SOLN: 0.2500 mg | INTRAMUSCULAR | Status: DC | PRN

## 2013-10-21 MED ORDER — PROPOFOL 10 MG/ML IV BOLUS
INTRAVENOUS | Status: AC
  Filled 2013-10-21: qty 20

## 2013-10-21 MED ORDER — ACETAMINOPHEN 650 MG RE SUPP: 650.0000 mg | Freq: Four times a day (QID) | RECTAL | Status: DC | PRN

## 2013-10-21 MED ORDER — CEFAZOLIN SODIUM-DEXTROSE 2-3 GM-% IV SOLR
INTRAVENOUS | Status: AC
  Filled 2013-10-21: qty 50

## 2013-10-21 MED ORDER — ONDANSETRON HCL 4 MG/2ML IJ SOLN
INTRAMUSCULAR | Status: AC
  Filled 2013-10-21: qty 2

## 2013-10-21 MED ORDER — ONDANSETRON HCL 4 MG/2ML IJ SOLN
INTRAMUSCULAR | Status: DC | PRN
  Administered 2013-10-21: 4 mg via INTRAVENOUS

## 2013-10-21 MED ORDER — BUPIVACAINE LIPOSOME 1.3 % IJ SUSP
20.0000 mL | Freq: Once | INTRAMUSCULAR | Status: DC
  Filled 2013-10-21: qty 20

## 2013-10-21 MED ORDER — MENTHOL 3 MG MT LOZG
1.0000 | LOZENGE | OROMUCOSAL | Status: DC | PRN
  Filled 2013-10-21: qty 9

## 2013-10-21 MED ORDER — SODIUM CHLORIDE 0.9 % IJ SOLN
INTRAMUSCULAR | Status: AC
  Filled 2013-10-21: qty 50

## 2013-10-21 MED ORDER — METHOCARBAMOL 1000 MG/10ML IJ SOLN
500.0000 mg | Freq: Four times a day (QID) | INTRAVENOUS | Status: DC | PRN
  Filled 2013-10-21: qty 5

## 2013-10-21 MED ORDER — TRANEXAMIC ACID 100 MG/ML IV SOLN
2000.0000 mg | Freq: Once | INTRAVENOUS | Status: DC
  Filled 2013-10-21: qty 20

## 2013-10-21 MED ORDER — METOCLOPRAMIDE HCL 5 MG/ML IJ SOLN: 5.0000 mg | Freq: Three times a day (TID) | INTRAMUSCULAR | Status: DC | PRN

## 2013-10-21 MED ORDER — TRANEXAMIC ACID 100 MG/ML IV SOLN
2000.0000 mg | INTRAVENOUS | Status: DC | PRN
  Administered 2013-10-21: 2000 mg via INTRAVENOUS

## 2013-10-21 MED ORDER — BUPIVACAINE HCL (PF) 0.25 % IJ SOLN
INTRAMUSCULAR | Status: AC
  Filled 2013-10-21: qty 30

## 2013-10-21 MED ORDER — DEXAMETHASONE SODIUM PHOSPHATE 10 MG/ML IJ SOLN
10.0000 mg | Freq: Once | INTRAMUSCULAR | Status: DC
  Administered 2013-10-21: 10 mg via INTRAVENOUS

## 2013-10-21 MED ORDER — LACTATED RINGERS IV SOLN
INTRAVENOUS | Status: DC | PRN
  Administered 2013-10-21 (×2): via INTRAVENOUS

## 2013-10-21 MED ORDER — EZETIMIBE 10 MG PO TABS
10.0000 mg | ORAL_TABLET | Freq: Every morning | ORAL | Status: DC
  Administered 2013-10-21 – 2013-10-23 (×3): 10 mg via ORAL
  Filled 2013-10-21 (×3): qty 1

## 2013-10-21 MED ORDER — ONDANSETRON HCL 4 MG/2ML IJ SOLN: 4.0000 mg | Freq: Four times a day (QID) | INTRAMUSCULAR | Status: DC | PRN

## 2013-10-21 MED ORDER — SODIUM CHLORIDE 0.9 % IV SOLN
INTRAVENOUS | Status: DC
Start: 2013-10-21 — End: 2013-10-23
  Administered 2013-10-21 – 2013-10-22 (×3): via INTRAVENOUS

## 2013-10-21 MED ORDER — LEVOTHYROXINE SODIUM 50 MCG PO TABS
50.0000 ug | ORAL_TABLET | Freq: Every day | ORAL | Status: DC
  Administered 2013-10-22 – 2013-10-23 (×2): 50 ug via ORAL
  Filled 2013-10-21 (×3): qty 1

## 2013-10-21 MED ORDER — LIDOCAINE HCL (CARDIAC) 20 MG/ML IV SOLN
INTRAVENOUS | Status: AC
  Filled 2013-10-21: qty 5

## 2013-10-21 MED ORDER — ONDANSETRON HCL 4 MG PO TABS: 4.0000 mg | ORAL_TABLET | Freq: Four times a day (QID) | ORAL | Status: DC | PRN

## 2013-10-21 MED ORDER — BUPIVACAINE LIPOSOME 1.3 % IJ SUSP
INTRAMUSCULAR | Status: DC | PRN
  Administered 2013-10-21: 20 mL

## 2013-10-21 MED ORDER — MORPHINE SULFATE 2 MG/ML IJ SOLN: 1.0000 mg | INTRAMUSCULAR | Status: DC | PRN

## 2013-10-21 MED ORDER — ACETAMINOPHEN 10 MG/ML IV SOLN
1000.0000 mg | Freq: Once | INTRAVENOUS | Status: DC
  Administered 2013-10-21: 1000 mg via INTRAVENOUS
  Filled 2013-10-21: qty 100

## 2013-10-21 MED ORDER — DOCUSATE SODIUM 100 MG PO CAPS
100.0000 mg | ORAL_CAPSULE | Freq: Two times a day (BID) | ORAL | Status: DC
  Administered 2013-10-21 – 2013-10-23 (×4): 100 mg via ORAL

## 2013-10-21 MED ORDER — MIDAZOLAM HCL 2 MG/2ML IJ SOLN
INTRAMUSCULAR | Status: AC
  Filled 2013-10-21: qty 2

## 2013-10-21 MED ORDER — DEXAMETHASONE SODIUM PHOSPHATE 10 MG/ML IJ SOLN
INTRAMUSCULAR | Status: AC
  Filled 2013-10-21: qty 1

## 2013-10-21 MED ORDER — PHENYLEPHRINE HCL 10 MG/ML IJ SOLN
INTRAMUSCULAR | Status: AC
  Filled 2013-10-21: qty 1

## 2013-10-21 MED ORDER — MIDAZOLAM HCL 5 MG/5ML IJ SOLN
INTRAMUSCULAR | Status: DC | PRN
  Administered 2013-10-21: 2 mg via INTRAVENOUS

## 2013-10-21 MED ORDER — ROCURONIUM BROMIDE 100 MG/10ML IV SOLN
INTRAVENOUS | Status: AC
  Filled 2013-10-21: qty 1

## 2013-10-21 MED ORDER — BUPIVACAINE HCL (PF) 0.75 % IJ SOLN
INTRAMUSCULAR | Status: DC | PRN
  Administered 2013-10-21: 15 mg via INTRATHECAL

## 2013-10-21 MED ORDER — DEXAMETHASONE SODIUM PHOSPHATE 10 MG/ML IJ SOLN
10.0000 mg | Freq: Once | INTRAMUSCULAR | Status: AC
  Administered 2013-10-22: 10 mg via INTRAVENOUS
  Filled 2013-10-21: qty 1

## 2013-10-21 MED ORDER — SODIUM CHLORIDE 0.9 % IV SOLN
10.0000 mg | INTRAVENOUS | Status: DC | PRN
  Administered 2013-10-21: 15 ug/min via INTRAVENOUS

## 2013-10-21 SURGICAL SUPPLY — 60 items
BAG ZIPLOCK 12X15 (MISCELLANEOUS) ×3 IMPLANT
BANDAGE ELASTIC 6 VELCRO ST LF (GAUZE/BANDAGES/DRESSINGS) ×3 IMPLANT
BANDAGE ESMARK 6X9 LF (GAUZE/BANDAGES/DRESSINGS) ×1 IMPLANT
BLADE SAG 18X100X1.27 (BLADE) ×3 IMPLANT
BLADE SAW SGTL 11.0X1.19X90.0M (BLADE) ×3 IMPLANT
BNDG ESMARK 6X9 LF (GAUZE/BANDAGES/DRESSINGS) ×3
BOWL SMART MIX CTS (DISPOSABLE) ×3 IMPLANT
CAPT RP KNEE ×3 IMPLANT
CEMENT HV SMART SET (Cement) ×6 IMPLANT
CLOSURE WOUND 1/2 X4 (GAUZE/BANDAGES/DRESSINGS) ×1
CUFF TOURN SGL QUICK 34 (TOURNIQUET CUFF) ×2
CUFF TRNQT CYL 34X4X40X1 (TOURNIQUET CUFF) ×1 IMPLANT
DECANTER SPIKE VIAL GLASS SM (MISCELLANEOUS) ×3 IMPLANT
DRAPE EXTREMITY TIBURON (DRAPES) ×3 IMPLANT
DRAPE POUCH INSTRU U-SHP 10X18 (DRAPES) ×3 IMPLANT
DRAPE U-SHAPE 47X51 STRL (DRAPES) ×3 IMPLANT
DRSG ADAPTIC 3X8 NADH LF (GAUZE/BANDAGES/DRESSINGS) ×3 IMPLANT
DRSG PAD ABDOMINAL 8X10 ST (GAUZE/BANDAGES/DRESSINGS) IMPLANT
DURAPREP 26ML APPLICATOR (WOUND CARE) ×3 IMPLANT
ELECT REM PT RETURN 9FT ADLT (ELECTROSURGICAL) ×3
ELECTRODE REM PT RTRN 9FT ADLT (ELECTROSURGICAL) ×1 IMPLANT
EVACUATOR 1/8 PVC DRAIN (DRAIN) ×3 IMPLANT
FACESHIELD WRAPAROUND (MASK) ×15 IMPLANT
GAUZE SPONGE 4X4 12PLY STRL (GAUZE/BANDAGES/DRESSINGS) IMPLANT
GLOVE BIO SURGEON STRL SZ7.5 (GLOVE) IMPLANT
GLOVE BIO SURGEON STRL SZ8 (GLOVE) ×3 IMPLANT
GLOVE BIOGEL PI IND STRL 6.5 (GLOVE) IMPLANT
GLOVE BIOGEL PI IND STRL 8 (GLOVE) ×1 IMPLANT
GLOVE BIOGEL PI INDICATOR 6.5 (GLOVE)
GLOVE BIOGEL PI INDICATOR 8 (GLOVE) ×2
GLOVE SURG SS PI 6.5 STRL IVOR (GLOVE) IMPLANT
GOWN STRL REUS W/TWL LRG LVL3 (GOWN DISPOSABLE) ×3 IMPLANT
GOWN STRL REUS W/TWL XL LVL3 (GOWN DISPOSABLE) IMPLANT
HANDPIECE INTERPULSE COAX TIP (DISPOSABLE) ×2
IMMOBILIZER KNEE 20 (SOFTGOODS) ×3 IMPLANT
IMMOBILIZER KNEE 20 THIGH 36 (SOFTGOODS) IMPLANT
KIT BASIN OR (CUSTOM PROCEDURE TRAY) ×3 IMPLANT
MANIFOLD NEPTUNE II (INSTRUMENTS) ×3 IMPLANT
NDL SAFETY ECLIPSE 18X1.5 (NEEDLE) ×2 IMPLANT
NEEDLE HYPO 18GX1.5 SHARP (NEEDLE) ×4
NS IRRIG 1000ML POUR BTL (IV SOLUTION) ×3 IMPLANT
PACK TOTAL JOINT (CUSTOM PROCEDURE TRAY) ×3 IMPLANT
PAD ABD 8X10 STRL (GAUZE/BANDAGES/DRESSINGS) ×3 IMPLANT
PADDING CAST COTTON 6X4 STRL (CAST SUPPLIES) ×3 IMPLANT
POSITIONER SURGICAL ARM (MISCELLANEOUS) ×3 IMPLANT
SET HNDPC FAN SPRY TIP SCT (DISPOSABLE) ×1 IMPLANT
STRIP CLOSURE SKIN 1/2X4 (GAUZE/BANDAGES/DRESSINGS) ×2 IMPLANT
SUCTION FRAZIER 12FR DISP (SUCTIONS) ×3 IMPLANT
SUT MNCRL AB 4-0 PS2 18 (SUTURE) ×3 IMPLANT
SUT VIC AB 2-0 CT1 27 (SUTURE) ×6
SUT VIC AB 2-0 CT1 TAPERPNT 27 (SUTURE) ×3 IMPLANT
SUT VLOC 180 0 24IN GS25 (SUTURE) ×3 IMPLANT
SYR 20CC LL (SYRINGE) ×3 IMPLANT
SYR 50ML LL SCALE MARK (SYRINGE) ×3 IMPLANT
TOWEL OR 17X26 10 PK STRL BLUE (TOWEL DISPOSABLE) ×3 IMPLANT
TOWEL OR NON WOVEN STRL DISP B (DISPOSABLE) IMPLANT
TRAY FOLEY CATH 14FRSI W/METER (CATHETERS) IMPLANT
TRAY FOLEY CATH 16FRSI W/METER (SET/KITS/TRAYS/PACK) ×3 IMPLANT
WATER STERILE IRR 1500ML POUR (IV SOLUTION) ×3 IMPLANT
WRAP KNEE MAXI GEL POST OP (GAUZE/BANDAGES/DRESSINGS) ×3 IMPLANT

## 2013-10-21 NOTE — Evaluation (Addendum)
Physical Therapy Evaluation Patient Details Name: [name] [name] MRN: [id number] DOB: 16-Sep-1940 Today's Date: 10/21/2013   History of Present Illness  s/p [name]  Clinical Impression  Pt will benefit from PT to address deficits below, facilitate return to independence; Plan is for  HHPT, pt has RW     Follow Up Recommendations Home health PT    Equipment Recommendations  None recommended by PT    Recommendations for Other Services       Precautions / Restrictions Precautions Required Braces or Orthoses: Knee Immobilizer - Right Knee Immobilizer - Right: Discontinue once straight leg raise with < 10 degree lag Restrictions Other Position/Activity Restrictions: WBAT      Mobility  Bed Mobility Overal bed mobility: Needs Assistance Bed Mobility: Supine to Sit     Supine to sit: Min assist     General bed mobility comments: assist with RLE, cues for technique  Transfers Overall transfer level: Needs assistance Equipment used: Rolling walker (2 wheeled) Transfers: Sit to/from Stand Sit to Stand: Min guard         General transfer comment: cues for hand placement and RLE position  Ambulation/Gait Ambulation/Gait assistance: Min guard Ambulation Distance (Feet): 100 Feet Assistive device: Rolling walker (2 wheeled) Gait Pattern/deviations: Step-to pattern;Step-through pattern;Decreased stride length;Antalgic   Gait velocity interpretation: Below normal speed for age/gender General Gait Details: cues for sequence and RW position  Stairs            Wheelchair Mobility    Modified Rankin (Stroke Patients Only)       Balance                                             Pertinent Vitals/Pain Pain Assessment: 0-10 Pain Score: 3  Pain Location: right knee Pain Intervention(s): Premedicated before session    Home Living Family/patient expects to be discharged to:: Private residence Living Arrangements: Spouse/significant  other   Type of Home: House Home Access: Stairs to enter   Technical brewer of Steps: 3 Home Layout: One level;Able to live on main level with bedroom/bathroom Home Equipment: Gilford Rile - 2 wheels;Bedside commode;Wheelchair - manual Additional Comments: equip belonged to pt mother-in-law    Prior Function Level of Independence: Independent               Hand Dominance        Extremity/Trunk Assessment   Upper Extremity Assessment: Defer to OT evaluation;Overall WFL for tasks assessed           Lower Extremity Assessment: RLE deficits/detail RLE Deficits / Details: assist for SLR, ankle WFL; knee flexion to 40*       Communication   Communication: No difficulties  Cognition Arousal/Alertness: Awake/alert Behavior During Therapy: WFL for tasks assessed/performed Overall Cognitive Status: Within Functional Limits for tasks assessed                      General Comments   pt sats 85% on RA and  HR 61 after amb; NT aware     Exercises Total Joint Exercises Ankle Circles/Pumps: AROM;10 reps;Both Quad Sets: AROM;Both;10 reps      Assessment/Plan    PT Assessment Patient needs continued PT services  PT Diagnosis Difficulty walking   PT Problem List Decreased strength;Decreased range of motion;Decreased activity tolerance;Decreased mobility;Decreased balance;Decreased knowledge of use of DME  PT Treatment Interventions DME  instruction;Gait training;Stair training;Functional mobility training;Therapeutic activities;Therapeutic exercise;Patient/family education   PT Goals (Current goals can be found in the Care Plan section) Acute Rehab PT Goals Patient Stated Goal: I  PT Goal Formulation: With patient Time For Goal Achievement: 10/25/13 Potential to Achieve Goals: Good    Frequency     Barriers to discharge        Co-evaluation               End of Session Equipment Utilized During Treatment: Gait belt Activity Tolerance: Patient  tolerated treatment well Patient left: with call bell/phone within reach Nurse Communication: Mobility status         Time: 1510-1534 PT Time Calculation (min): 24 min   Charges:   PT Evaluation $Initial PT Evaluation Tier I: 1 Procedure PT Treatments $Gait Training: 23-37 mins   PT G CodesKenyon [name], PT Pager: [phone] 10/21/2013   [facility] 10/21/2013, 3:49 PM

## 2013-10-21 NOTE — Anesthesia Procedure Notes (Signed)
Spinal  Patient location during procedure: OR Start time: 10/21/2013 8:15 AM End time: 10/21/2013 8:18 AM Staffing CRNA/Resident: Harle Stanford R Performed by: resident/CRNA  Preanesthetic Checklist Completed: patient identified, site marked, surgical consent, pre-op evaluation, timeout performed, IV checked, risks and benefits discussed and monitors and equipment checked Spinal Block Patient position: sitting Prep: Betadine Patient monitoring: heart rate, cardiac monitor, continuous pulse ox and blood pressure Approach: midline Location: L3-4 Needle Needle type: Spinocan  Needle gauge: 22 G Needle length: 9 cm Needle insertion depth: 7 cm Assessment Sensory level: T6

## 2013-10-21 NOTE — H&P (View-Only) (Signed)
[name] [name] DOB: 10-12-1940 Married / Language: English / Race: White Male Date of Admission:  10-21-2013 Chief Complaint:  Right Knee Pain History of Present Illness The patient is a 73 year old male who comes in for a preoperative History and Physical. The patient is scheduled for a right total knee arthroplasty to be performed by Dr. Dione Plover. Aluisio, MD at [facility] on 10/21/2013. The patient is a 73 year old male who presents for follow up of their knee. The patient is being followed for their bilateral knee pain. They are now several months out from cortisone injections. Symptoms reported today include: pain. The patient feels that they are doing poorly (Patient states that the injections only helped for a couple hours. ). The following medication has been used for pain control: Norco. Unfortunately, the cortisone was minimally beneficial. He states that the right knee is bothering him currently more than the left. He has documented significant arthritis bone on bone medial and patellofemoral both knees. He feels as though the knee is preventing him from doing things that he desires. He would like to proceed with surgery at this time. They have been treated conservatively in the past for the above stated problem and despite conservative measures, they continue to have progressive pain and severe functional limitations and dysfunction. They have failed non-operative management including home exercise, medications, and injections. It is felt that they would benefit from undergoing total joint replacement. Risks and benefits of the procedure have been discussed with the patient and they elect to proceed with surgery. There are no active contraindications to surgery such as ongoing infection or rapidly progressive neurological disease.  Allergies Sulfanilamide *CHEMICALS* Hives. Blisters ACE Inhibitors Cough. 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) Impaired Vision Impaired Memory Tinnitus Bronchitis Past History Pneumonia Past History Hypertension Coronary Artery Disease/Heart Disease Hypercholesterolemia Phlebitis Deep vein thrombosis Pulmonary Embolism Kidney Stone History of Left Tibia Fracture 1970 Measles Mumps  Social History Alcohol use never consumed alcohol Current work status retired Illicit drug use no Children 3 Drug/Alcohol Rehab (Previously) no Drug/Alcohol Rehab (Currently) no Marital status married Tobacco use never smoker Tobacco / smoke exposure no Living situation live with spouse Pain Contract no Post-Surgical Plans Home Advance Directives Living Will, Healthcare POA  Medication History Zetia (10MG  Tablet, Oral) Active. Coumadin (5MG  Tablet, Oral) Active. Toprol XL (50MG  Tablet ER, Oral) Active. Aspirin (325MG  Tablet, Oral) Active. Cozaar (100MG  Tablet, Oral) Active. Viagra (100MG  Tablet, Oral) Active. (prn) Thyrox (50MCG Tablet, Oral) Active. Ultram (50MG  Tablet, Oral) Active. Fish Oil Active. Multivitamin Active.  Past Surgical History  Ankle Surgery Date: 56. left CABG Date: 1995. Circumcision Date: 1964. Hydrocele Date: 61.  Review of Systems General Not Present- Chills, Fatigue, Fever, Memory Loss, Night Sweats, Weight Gain and Weight Loss. Skin Not Present- Eczema, Hives, Itching, Lesions and Rash. HEENT Not Present- Dentures, Double Vision, Headache, Hearing Loss, Tinnitus and Visual Loss. Respiratory Not Present- Allergies, Chronic Cough, Coughing up blood, Shortness of breath at rest and Shortness of breath with exertion. Cardiovascular Not Present- Chest Pain, Difficulty Breathing Lying Down, Murmur, Palpitations, Racing/skipping heartbeats and  Swelling. Gastrointestinal Not Present- Abdominal Pain, Bloody Stool, Constipation, Diarrhea, Difficulty Swallowing, Heartburn, Jaundice, Loss of appetitie, Nausea and Vomiting. Male Genitourinary Not Present- Blood in Urine, Discharge, Flank Pain, Incontinence, Painful Urination, Urgency, Urinary frequency, Urinary Retention, Urinating at  Night and Weak urinary stream. Musculoskeletal Not Present- Back Pain, Joint Pain, Joint Swelling, Morning Stiffness, Muscle Pain, Muscle Weakness and Spasms. Neurological Not Present- Blackout spells, Difficulty with balance, Dizziness, Paralysis, Tremor and Weakness. Psychiatric Not Present- Insomnia.  Vitals Weight: 248 lb Height: 68in Body Surface Area: 2.32 m Body Mass Index: 37.71 kg/m Pulse: 56 (Regular)  Resp.: 16 (Unlabored)  BP: 148/90 (Sitting, Right Arm, Standard)  Physical Exam General Mental Status -Alert, cooperative and good historian. General Appearance-pleasant, Not in acute distress. Orientation-Oriented X3. Build & Nutrition-Well nourished and Well developed.  Head and Neck Head-normocephalic, atraumatic . Neck Global Assessment - supple, no bruit auscultated on the right, no bruit auscultated on the left.  Eye Vision-Wears corrective lenses. Pupil - Bilateral-Regular and Round. Motion - Bilateral-EOMI.  ENMT Note: bilateral hearing aids   Chest and Lung Exam Auscultation Breath sounds - clear at anterior chest wall and clear at posterior chest wall. Adventitious sounds - No Adventitious sounds.  Cardiovascular Auscultation Rhythm - Regular rate and rhythm. Heart Sounds - S1 WNL and S2 WNL. Murmurs & Other Heart Sounds: Murmur 1 - Location - Aortic Area and Pulmonic Area. Timing - Holosystolic. Grade - II/VI. Character - Low pitched.  Abdomen Inspection Contour - Generalized moderate distention. Palpation/Percussion Tenderness - Abdomen is non-tender to palpation. Rigidity (guarding) -  Abdomen is soft. Auscultation Auscultation of the abdomen reveals - Bowel sounds normal.  Male Genitourinary Note: Not done, not pertinent to present illness   Musculoskeletal Note: On exam, he is alert and oriented in no apparent distress. His hips show normal range of motion with no discomfort. His right knee shows no effusion. Range of motion is about 5-125. The left knee no effusion. Varus deformity. Range is about 5-125 on both knees. He is tender medial greater than lateral with no instability noted.  RADIOGRAPHS: Radiographs are reviewed. AP both knees and lateral of both show that he has advanced arthritic change in both in the medial and patellofemoral compartments.  Assessment & Plan Osteoarthritis of right knee (715.96  M17.9) Current Plans Started Lovenox 40MG /0.4ML, 1 (one) Solution daily, 4 Syringe Sub-Q injections daily for four days prior to surgery. Take each morning on Oct. 1st through Oct. 4th  Note:Plan is for a Right Total Knee Replacement by Dr. Wynelle Link.  Plan is to go home.  PCP - Dr. Gar Ponto - Patient has been seen preoperatively and felt to be stable for surgery.  The patient will receive topical TXA (tranexamic acid) due to: CAD, DVT, PE  Please note that the patient was instructed to stop his Coumadin four days prior to surgery on Sept 30th. He will use Lovenox Bridge injections for four days prior to surgery. He will be placed back onto Coumadin postop along with Lovenox bridging until his Coumadin is back into a therapeutic range.   Signed electronically by Joelene Millin, III PA-C

## 2013-10-21 NOTE — Addendum Note (Signed)
Addendum created 10/21/13 1106 by Lind Covert, CRNA   Modules edited: Anesthesia Medication Administration

## 2013-10-21 NOTE — Interval H&P Note (Signed)
History and Physical Interval Note:  10/21/2013 7:09 AM  [name] [name]  has presented today for surgery, with the diagnosis of right knee osteoarthritis  The various methods of treatment have been discussed with the patient and family. After consideration of risks, benefits and other options for treatment, the patient has consented to  Procedure(s): RIGHT TOTAL KNEE ARTHROPLASTY (Right) as a surgical intervention .  The patient's history has been reviewed, patient examined, no change in status, stable for surgery.  I have reviewed the patient's chart and labs.  Questions were answered to the patient's satisfaction.     [name]

## 2013-10-21 NOTE — Anesthesia Postprocedure Evaluation (Signed)
  Anesthesia Post-op Note  Patient: [name] [name]  Procedure(s) Performed: Procedure(s) (LRB): RIGHT TOTAL KNEE ARTHROPLASTY (Right)  Patient Location: PACU  Anesthesia Type: Spinal  Level of Consciousness: awake and alert   Airway and Oxygen Therapy: Patient Spontanous Breathing  Post-op Pain: mild  Post-op Assessment: Post-op Vital signs reviewed, Patient's Cardiovascular Status Stable, Respiratory Function Stable, Patent Airway and No signs of Nausea or vomiting  Last Vitals:  Filed Vitals:   10/21/13 1046  BP: 143/66  Pulse: 50  Temp: 36.4 C  Resp: 14    Post-op Vital Signs: stable   Complications: No apparent anesthesia complications

## 2013-10-21 NOTE — Transfer of Care (Signed)
Immediate Anesthesia Transfer of Care Note  Patient: [name] [name]  Procedure(s) Performed: Procedure(s): RIGHT TOTAL KNEE ARTHROPLASTY (Right)  Patient Location: PACU  Anesthesia Type:Spinal  Level of Consciousness: sedated  Airway & Oxygen Therapy: Patient Spontanous Breathing and Patient connected to face mask oxygen  Post-op Assessment: Report given to PACU RN and Post -op Vital signs reviewed and stable  Post vital signs: Reviewed and stable  Complications: No apparent anesthesia complications

## 2013-10-21 NOTE — Progress Notes (Signed)
ANTICOAGULATION CONSULT NOTE - Initial Consult  Pharmacy Consult for Warfarin Indication: VTE prophylaxis, chronic warfarin for h/o DVT/PE, Protein S deficiency  Allergies  Allergen Reactions  . Ace Inhibitors     Cramps, Weak muscles.   . Sulfa Antibiotics Hives    Patient Measurements:    Vital Signs: Temp: 97.6 F (36.4 C) (10/05 1046) Temp Source: Oral (10/05 0512) BP: 143/66 mmHg (10/05 1046) Pulse Rate: 50 (10/05 1046)  Labs:  Recent Labs  10/21/13 0705  LABPROT 13.5  INR 1.02    CrCl is unknown because there is no height on file for the current visit.   Medical History: Past Medical History  Diagnosis Date  . Hypertension   . Peripheral vascular disease     hx phelbitis  . Arthritis   . Blood dyscrasia     protein S deficiency  . Tibia fracture     no surgery- got phlebitis at leg under cast   . DVT (deep venous thrombosis)     lungs, legs, arms at various times- Protein S deficiency    Assessment: 26 yoM on chronic warfarin PTA for history of DVT/PE and protein S deficiency.  Patient was instructed to hold warfarin 4 days prior to right TKA with Lovenox bridge.  Now s/p surgery and pharmacy consulted to resume warfarin postop.  Lovenox 30 mg IV q12h to begin 10/6 (AM of POD1) until INR >/= 1.8 per ortho.  Patient's home warfarin dose reported as 5 mg daily.  INR 1.05 this AM pre-op CBC WNL 9/28  Goal of Therapy:  INR 2-3   Plan:  1.  Warfarin 7.5 mg po once tonight. 2.  Daily INR.  [name] [name] 10/21/2013,11:08 AM

## 2013-10-21 NOTE — Op Note (Signed)
Pre-operative diagnosis- Osteoarthritis  Right knee(s)  Post-operative diagnosis- Osteoarthritis Right knee(s)  Procedure-  Right  Total Knee Arthroplasty  Surgeon- Dione Plover. Lilana Blasko, MD  Assistant- Arlee Muslim, PA-C   Anesthesia-  Spinal  EBL-* No blood loss amount entered *   Drains Hemovac  Tourniquet time-  Total Tourniquet Time Documented: Thigh (Right) - 35 minutes Total: Thigh (Right) - 35 minutes     Complications- None  Condition-PACU - hemodynamically stable.   Brief Clinical Note  [name] [name] is a 73 y.o. year old male with end stage OA of his right knee with progressively worsening pain and dysfunction. He has constant pain, with activity and at rest and significant functional deficits with difficulties even with ADLs. He has had extensive non-op management including analgesics, injections of cortisone, and home exercise program, but remains in significant pain with significant dysfunction. Radiographs show bone on bone arthritis medial and patellofemoral with significant varus deformity. He presents now for rightt Total Knee Arthroplasty.    Procedure in detail---   The patient is brought into the operating room and positioned supine on the operating table. After successful administration of  Spinal,   a tourniquet is placed high on the  Right thigh(s) and the lower extremity is prepped and draped in the usual sterile fashion. Time out is performed by the operating team and then the  Right lower extremity is wrapped in Esmarch, knee flexed and the tourniquet inflated to 300 mmHg.       A midline incision is made with a ten blade through the subcutaneous tissue to the level of the extensor mechanism. A fresh blade is used to make a medial parapatellar arthrotomy. Soft tissue over the proximal medial tibia is subperiosteally elevated to the joint line with a knife and into the semimembranosus bursa with a Cobb elevator. Soft tissue over the proximal lateral tibia is  elevated with attention being paid to avoiding the patellar tendon on the tibial tubercle. The patella is everted, knee flexed 90 degrees and the ACL and PCL are removed. Findings are bone on bone medial and patellofemoral with large global osteophytes.        The drill is used to create a starting hole in the distal femur and the canal is thoroughly irrigated with sterile saline to remove the fatty contents. The 5 degree Right  valgus alignment guide is placed into the femoral canal and the distal femoral cutting block is pinned to remove 10 mm off the distal femur. Resection is made with an oscillating saw.      The tibia is subluxed forward and the menisci are removed. The extramedullary alignment guide is placed referencing proximally at the medial aspect of the tibial tubercle and distally along the second metatarsal axis and tibial crest. The block is pinned to remove 52mm off the more deficient medial  side. Resection is made with an oscillating saw. Size 4is the most appropriate size for the tibia and the proximal tibia is prepared with the modular drill and keel punch for that size.      The femoral sizing guide is placed and size 5 is most appropriate. Rotation is marked off the epicondylar axis and confirmed by creating a rectangular flexion gap at 90 degrees. The size 5 cutting block is pinned in this rotation and the anterior, posterior and chamfer cuts are made with the oscillating saw. The intercondylar block is then placed and that cut is made.      Trial size 4 tibial  component, trial size 5 posterior stabilized femur and a 12.5  mm posterior stabilized rotating platform insert trial is placed. Full extension is achieved with excellent varus/valgus and anterior/posterior balance throughout full range of motion. The patella is everted and thickness measured to be 24  mm. Free hand resection is taken to 14 mm, a 38 template is placed, lug holes are drilled, trial patella is placed, and it tracks  normally. Osteophytes are removed off the posterior femur with the trial in place. All trials are removed and the cut bone surfaces prepared with pulsatile lavage. Cement is mixed and once ready for implantation, the size 4 tibial implant, size  5 posterior stabilized femoral component, and the size 38 patella are cemented in place and the patella is held with the clamp. The trial insert is placed and the knee held in full extension. The Exparel (20 ml mixed with 30 ml saline) and .25% Bupivicaine, are injected into the extensor mechanism, posterior capsule, medial and lateral gutters and subcutaneous tissues.  All extruded cement is removed and once the cement is hard the permanent 12.5 mm posterior stabilized rotating platform insert is placed into the tibial tray.      The wound is copiously irrigated with saline solution and the extensor mechanism closed over a hemovac drain with #1 V-loc suture. The tourniquet is released for a total tourniquet time of 35  minutes. Flexion against gravity is 140 degrees and the patella tracks normally. Subcutaneous tissue is closed with 2.0 vicryl and subcuticular with running 4.0 Monocryl. The incision is cleaned and dried and steri-strips and a bulky sterile dressing are applied. The limb is placed into a knee immobilizer and the patient is awakened and transported to recovery in stable condition.      Please note that a surgical assistant was a medical necessity for this procedure in order to perform it in a safe and expeditious manner. Surgical assistant was necessary to retract the ligaments and vital neurovascular structures to prevent injury to them and also necessary for proper positioning of the limb to allow for anatomic placement of the prosthesis.   Dione Plover Brizeyda Holtmeyer, MD    10/21/2013, 9:21 AM

## 2013-10-21 NOTE — Addendum Note (Signed)
Addendum created 10/21/13 1624 by Lind Covert, CRNA   Modules edited: Anesthesia Medication Administration

## 2013-10-22 LAB — CBC
HCT: 40.5 % (ref 39.0–52.0)
HEMOGLOBIN: 13.6 g/dL (ref 13.0–17.0)
MCH: 31.8 pg (ref 26.0–34.0)
MCHC: 33.6 g/dL (ref 30.0–36.0)
MCV: 94.6 fL (ref 78.0–100.0)
Platelets: 182 10*3/uL (ref 150–400)
RBC: 4.28 MIL/uL (ref 4.22–5.81)
RDW: 13.2 % (ref 11.5–15.5)
WBC: 17.4 10*3/uL — AB (ref 4.0–10.5)

## 2013-10-22 LAB — BASIC METABOLIC PANEL
Anion gap: 11 (ref 5–15)
BUN: 24 mg/dL — ABNORMAL HIGH (ref 6–23)
CHLORIDE: 101 meq/L (ref 96–112)
CO2: 23 mEq/L (ref 19–32)
Calcium: 8.2 mg/dL — ABNORMAL LOW (ref 8.4–10.5)
Creatinine, Ser: 1.1 mg/dL (ref 0.50–1.35)
GFR calc Af Amer: 75 mL/min — ABNORMAL LOW (ref >90)
GFR calc non Af Amer: 65 mL/min — ABNORMAL LOW (ref >90)
GLUCOSE: 181 mg/dL — AB (ref 70–99)
POTASSIUM: 5.1 meq/L (ref 3.7–5.3)
SODIUM: 135 meq/L — AB (ref 137–147)

## 2013-10-22 LAB — PROTIME-INR
INR: 1.12 (ref 0.00–1.49)
PROTHROMBIN TIME: 14.5 s (ref 11.6–15.2)

## 2013-10-22 MED ORDER — OXYCODONE HCL 5 MG PO TABS: 5.0000 mg | ORAL_TABLET | ORAL | Status: DC | PRN

## 2013-10-22 MED ORDER — ENOXAPARIN SODIUM 40 MG/0.4ML ~~LOC~~ SOLN: 40.0000 mg | SUBCUTANEOUS | Status: DC

## 2013-10-22 MED ORDER — POLYETHYLENE GLYCOL 3350 17 G PO PACK: 17.0000 g | PACK | Freq: Every day | ORAL | Status: AC | PRN

## 2013-10-22 MED ORDER — WARFARIN SODIUM 7.5 MG PO TABS
7.5000 mg | ORAL_TABLET | Freq: Once | ORAL | Status: AC
  Administered 2013-10-22: 7.5 mg via ORAL
  Filled 2013-10-22: qty 1

## 2013-10-22 MED ORDER — METHOCARBAMOL 500 MG PO TABS: 500.0000 mg | ORAL_TABLET | Freq: Four times a day (QID) | ORAL | Status: DC | PRN

## 2013-10-22 MED ORDER — DSS 100 MG PO CAPS: 100.0000 mg | ORAL_CAPSULE | Freq: Two times a day (BID) | ORAL | Status: AC

## 2013-10-22 MED ORDER — WARFARIN SODIUM 5 MG PO TABS: 5.0000 mg | ORAL_TABLET | Freq: Every morning | ORAL | Status: DC

## 2013-10-22 MED ORDER — TRAMADOL HCL 50 MG PO TABS: 50.0000 mg | ORAL_TABLET | Freq: Four times a day (QID) | ORAL | Status: DC | PRN

## 2013-10-22 MED ORDER — ONDANSETRON HCL 4 MG PO TABS: 4.0000 mg | ORAL_TABLET | Freq: Four times a day (QID) | ORAL | Status: DC | PRN

## 2013-10-22 MED ORDER — BISACODYL 10 MG RE SUPP: 10.0000 mg | Freq: Every day | RECTAL | Status: AC | PRN

## 2013-10-22 NOTE — Care Management Note (Signed)
    Page 1 of 2   10/22/2013     1:27:00 PM CARE MANAGEMENT NOTE 10/22/2013  Patient:  [name],[name]   Account Number:  [id number]  Date Initiated:  10/22/2013  Documentation initiated by:  [facility]  Subjective/Objective Assessment:   adm: RIGHT TOTAL KNEE ARTHROPLASTY (Right)     Action/Plan:   discharge planning   Anticipated DC Date:  10/23/2013   Anticipated DC Plan:  [name] [name]  CM consult      [name]  HOME HEALTH   Choice offered to / List presented to:  C-1 Patient   DME arranged  NA      DME agency  NA     [facility] arranged  HH-2 PT  HH-1 RN      [facility]   Status of service:  Completed, signed off Medicare Important Message given?   (If response is "NO", the following Medicare IM given date fields will be blank) Date Medicare IM given:   Medicare IM given by:   Date Additional Medicare IM given:   Additional Medicare IM given by:    Discharge Disposition:  [place]  Per UR Regulation:    If discussed at Long Length of Stay Meetings, dates discussed:    Comments:  10/22/13 13:15 CM met with pt in room to offer choice of home health agency.  Pt chooses [name] [name] to render HHPT/RN for INR monitoring.  No DME is needed as pt states he has both a rolling walker and 3n1 at home.  Address and contact information verified with pt.  Referral called to [name] [name].  No other CM needs were communicated.  [name] [name], BSN, CM [phone].

## 2013-10-22 NOTE — Discharge Instructions (Addendum)
° °Dr. Frank Aluisio °Total Joint Specialist °[facility] °[address] °[place], [place] [address] °[phone] ° °TOTAL KNEE REPLACEMENT POSTOPERATIVE DIRECTIONS ° ° ° °Knee Rehabilitation, Guidelines Following Surgery  °Results after knee surgery are often greatly improved when you follow the exercise, range of motion and muscle strengthening exercises prescribed by your doctor. Safety measures are also important to protect the knee from further injury. Any time any of these exercises cause you to have increased pain or swelling in your knee joint, decrease the amount until you are comfortable again and slowly increase them. If you have problems or questions, call your caregiver or physical therapist for advice.  ° °HOME CARE INSTRUCTIONS  °Remove items at home which could result in a fall. This includes throw rugs or furniture in walking pathways.  °Continue medications as instructed at time of discharge. °You may have some home medications which will be placed on hold until you complete the course of blood thinner medication.  °You may start showering once you are discharged home but do not submerge the incision under water. Just pat the incision dry and apply a dry gauze dressing on daily. °Walk with walker as instructed.  °You may resume a sexual relationship in one month or when given the OK by  your doctor.  °· Use walker as long as suggested by your caregivers. °· Avoid periods of inactivity such as sitting longer than an hour when not asleep. This helps prevent blood clots.  °You may put full weight on your legs and walk as much as is comfortable.  °You may return to work once you are cleared by your doctor.  °Do not drive a car for 6 weeks or until released by you surgeon.  °· Do not drive while taking narcotics.  °Wear the elastic stockings for three weeks following surgery during the day but you may remove then at night. °Make sure you keep all of your appointments after your  operation with all of your doctors and caregivers. You should call the office at the above phone number and make an appointment for approximately two weeks after the date of your surgery. °Change the dressing daily and reapply a dry dressing each time. °Please pick up a stool softener and laxative for home use as long as you are requiring pain medications. °· Continue to use ice on the knee for pain and swelling from surgery. You may notice swelling that will progress down to the foot and ankle.  This is normal after surgery.  Elevate the leg when you are not up walking on it.   °It is important for you to complete the blood thinner medication as prescribed by your doctor. °· Continue to use the breathing machine which will help keep your temperature down.  It is common for your temperature to cycle up and down following surgery, especially at night when you are not up moving around and exerting yourself.  The breathing machine keeps your lungs expanded and your temperature down. ° °RANGE OF MOTION AND STRENGTHENING EXERCISES  °Rehabilitation of the knee is important following a knee injury or an operation. After just a few days of immobilization, the muscles of the thigh which control the knee become weakened and shrink (atrophy). Knee exercises are designed to build up the tone and strength of the thigh muscles and to improve knee motion. Often times heat used for twenty to thirty minutes before working out will loosen up your tissues and help with improving the   range of motion but do not use heat for the first two weeks following surgery. These exercises can be done on a training (exercise) mat, on the floor, on a table or on a bed. Use what ever works the best and is most comfortable for you Knee exercises include:  Leg Lifts - While your knee is still immobilized in a splint or cast, you can do straight leg raises. Lift the leg to 60 degrees, hold for 3 sec, and slowly lower the leg. Repeat 10-20 times 2-3  times daily. Perform this exercise against resistance later as your knee gets better.  Quad and Hamstring Sets - Tighten up the muscle on the front of the thigh (Quad) and hold for 5-10 sec. Repeat this 10-20 times hourly. Hamstring sets are done by pushing the foot backward against an object and holding for 5-10 sec. Repeat as with quad sets.  A rehabilitation program following serious knee injuries can speed recovery and prevent re-injury in the future due to weakened muscles. Contact your doctor or a physical therapist for more information on knee rehabilitation.   SKILLED REHAB INSTRUCTIONS: If the patient is transferred to a skilled rehab facility following release from the hospital, a list of the current medications will be sent to the facility for the patient to continue.  When discharged from the skilled rehab facility, please have the facility set up the patient's [place] prior to being released. Also, the skilled facility will be responsible for providing the patient with their medications at time of release from the facility to include their pain medication, the muscle relaxants, and their blood thinner medication. If the patient is still at the rehab facility at time of the two week follow up appointment, the skilled rehab facility will also need to assist the patient in arranging follow up appointment in our office and any transportation needs.  MAKE SURE YOU:  Understand these instructions.  Will watch your condition.  Will get help right away if you are not doing well or get worse.    Pick up stool softner and laxative for home. Do not submerge incision under water. May shower. Continue to use ice for pain and swelling from surgery.  Take Coumadin for three weeks for postoperative protocol and then the patient may resume their previous Coumadin home regimen.  The dose may need to be adjusted based upon the INR.  Please follow the INR and titrate Coumadin dose for  a therapeutic range between 2.0 and 3.0 INR.  After completing the three weeks of Coumadin, the patient may resume their previous Coumadin home regimen.  Continue Lovenox injections until the INR is therapeutic at or greater than 2.0.  When INR reaches the therapeutic level of equal to or greater than 2.0, the patient may discontinue the Lovenox injections.

## 2013-10-22 NOTE — Progress Notes (Signed)
   Subjective: 1 Day Post-Op Procedure(s) (LRB): RIGHT TOTAL KNEE ARTHROPLASTY (Right) Patient reports pain as mild.   Patient seen in rounds with Dr. Wynelle Link.  Walked 100 feet yesterday with therapy and already doing SLR's. Patient is well, and has had no acute complaints or problems We resume therapy today.  Plan is to go Home after hospital stay.  Objective: Vital signs in last 24 hours: Temp:  [97.5 F (36.4 C)-98 F (36.7 C)] 97.8 F (36.6 C) (10/06 0611) Pulse Rate:  [50-60] 50 (10/06 0611) Resp:  [12-25] 18 (10/06 0611) BP: (114-169)/(57-75) 152/75 mmHg (10/06 0611) SpO2:  [91 %-100 %] 98 % (10/06 0611) Weight:  [111.222 kg (245 lb 3.2 oz)] 111.222 kg (245 lb 3.2 oz) (10/05 1100)  Intake/Output from previous day:  Intake/Output Summary (Last 24 hours) at 10/22/13 0756 Last data filed at 10/22/13 0500  Gross per 24 hour  Intake   4180 ml  Output   1801 ml  Net   2379 ml    Intake/Output this shift:    Labs:  Recent Labs  10/22/13 0510  HGB 13.6    Recent Labs  10/22/13 0510  WBC 17.4*  RBC 4.28  HCT 40.5  PLT 182    Recent Labs  10/22/13 0510  NA 135*  K 5.1  CL 101  CO2 23  BUN 24*  CREATININE 1.10  GLUCOSE 181*  CALCIUM 8.2*    Recent Labs  10/21/13 0705 10/22/13 0510  INR 1.02 1.12    EXAM General - Patient is Alert, Appropriate and Oriented Extremity - Neurovascular intact Sensation intact distally Dorsiflexion/Plantar flexion intact Dressing - dressing C/D/I Motor Function - intact, moving foot and toes well on exam.  Hemovac pulled without difficulty.  Past Medical History  Diagnosis Date  . Hypertension   . Peripheral vascular disease     hx phelbitis  . Arthritis   . Blood dyscrasia     protein S deficiency  . Tibia fracture     no surgery- got phlebitis at leg under cast   . 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   Weight as of this encounter: 111.222 kg (245 lb 3.2 oz). Advance diet Up with therapy Plan for discharge tomorrow Discharge home with home health  DVT Prophylaxis - Lovenox and Coumadin, Lovenox bridging until his Coumadin is back into a therapeutic range.  Weight-Bearing as tolerated to right leg D/C O2 and Pulse OX and try on Room Air  Arlee Muslim, PA-C Orthopaedic Surgery 10/22/2013, 7:56 AM

## 2013-10-22 NOTE — Discharge Summary (Signed)
Physician Discharge Summary   Patient ID: [name] [name] MRN: [id number] DOB/AGE: 1941/01/12 73 y.o.  Admit date: 10/21/2013 Discharge date: 10/23/2013  Primary Diagnosis:  Osteoarthritis Right knee(s)  Admission Diagnoses:  Past Medical History  Diagnosis Date  . Hypertension   . Peripheral vascular disease     hx phelbitis  . Arthritis   . Blood dyscrasia     protein S deficiency  . Tibia fracture     no surgery- got phlebitis at leg under cast   . DVT (deep venous thrombosis)     lungs, legs, arms at various times- Protein S deficiency   Discharge Diagnoses:   Principal Problem:   OA (osteoarthritis) of knee  Estimated body mass index is 36.74 kg/(m^2) as calculated from the following:   Height as of this encounter: 5' 8.5" (1.74 m).   Weight as of this encounter: 111.222 kg (245 lb 3.2 oz).  Procedure:  Procedure(s) (LRB): RIGHT TOTAL KNEE ARTHROPLASTY (Right)   Consults: None  HPI: [name] [name] is a 73 y.o. year old male with end stage OA of his right knee with progressively worsening pain and dysfunction. He has constant pain, with activity and at rest and significant functional deficits with difficulties even with ADLs. He has had extensive non-op management including analgesics, injections of cortisone, and home exercise program, but remains in significant pain with significant dysfunction. Radiographs show bone on bone arthritis medial and patellofemoral with significant varus deformity. He presents now for rightt Total Knee Arthroplasty.   Laboratory Data: Admission on 10/21/2013, Discharged on 10/23/2013  Component Date Value Ref Range Status  . Prothrombin Time 10/21/2013 13.5  11.6 - 15.2 seconds Final  . INR 10/21/2013 1.02  0.00 - 1.49 Final  . WBC 10/22/2013 17.4* 4.0 - 10.5 K/uL Final  . RBC 10/22/2013 4.28  4.22 - 5.81 MIL/uL Final  . Hemoglobin 10/22/2013 13.6  13.0 - 17.0 g/dL Final  . HCT 10/22/2013 40.5  39.0 - 52.0 % Final  . MCV  10/22/2013 94.6  78.0 - 100.0 fL Final  . MCH 10/22/2013 31.8  26.0 - 34.0 pg Final  . MCHC 10/22/2013 33.6  30.0 - 36.0 g/dL Final  . RDW 10/22/2013 13.2  11.5 - 15.5 % Final  . Platelets 10/22/2013 182  150 - 400 K/uL Final  . Sodium 10/22/2013 135* 137 - 147 mEq/L Final  . Potassium 10/22/2013 5.1  3.7 - 5.3 mEq/L Final  . Chloride 10/22/2013 101  96 - 112 mEq/L Final  . CO2 10/22/2013 23  19 - 32 mEq/L Final  . Glucose, Bld 10/22/2013 181* 70 - 99 mg/dL Final  . BUN 10/22/2013 24* 6 - 23 mg/dL Final  . Creatinine, Ser 10/22/2013 1.10  0.50 - 1.35 mg/dL Final  . Calcium 10/22/2013 8.2* 8.4 - 10.5 mg/dL Final  . GFR calc non Af Amer 10/22/2013 65* >90 mL/min Final  . GFR calc Af Amer 10/22/2013 75* >90 mL/min Final   Comment: (NOTE)                          The eGFR has been calculated using the CKD EPI equation.                          This calculation has not been validated in all clinical situations.  eGFR's persistently <90 mL/min signify possible Chronic Kidney                          Disease.  . Anion gap 10/22/2013 11  5 - 15 Final  . Prothrombin Time 10/22/2013 14.5  11.6 - 15.2 seconds Final  . INR 10/22/2013 1.12  0.00 - 1.49 Final  . WBC 10/23/2013 18.0* 4.0 - 10.5 K/uL Final  . RBC 10/23/2013 4.13* 4.22 - 5.81 MIL/uL Final  . Hemoglobin 10/23/2013 13.1  13.0 - 17.0 g/dL Final  . HCT 10/23/2013 39.8  39.0 - 52.0 % Final  . MCV 10/23/2013 96.4  78.0 - 100.0 fL Final  . MCH 10/23/2013 31.7  26.0 - 34.0 pg Final  . MCHC 10/23/2013 32.9  30.0 - 36.0 g/dL Final  . RDW 10/23/2013 13.5  11.5 - 15.5 % Final  . Platelets 10/23/2013 175  150 - 400 K/uL Final  . Sodium 10/23/2013 135* 137 - 147 mEq/L Final  . Potassium 10/23/2013 5.4* 3.7 - 5.3 mEq/L Final  . Chloride 10/23/2013 102  96 - 112 mEq/L Final  . CO2 10/23/2013 22  19 - 32 mEq/L Final  . Glucose, Bld 10/23/2013 161* 70 - 99 mg/dL Final  . BUN 10/23/2013 27* 6 - 23 mg/dL Final  . Creatinine,  Ser 10/23/2013 1.08  0.50 - 1.35 mg/dL Final  . Calcium 10/23/2013 8.2* 8.4 - 10.5 mg/dL Final  . GFR calc non Af Amer 10/23/2013 66* >90 mL/min Final  . GFR calc Af Amer 10/23/2013 77* >90 mL/min Final   Comment: (NOTE)                          The eGFR has been calculated using the CKD EPI equation.                          This calculation has not been validated in all clinical situations.                          eGFR's persistently <90 mL/min signify possible Chronic Kidney                          Disease.  . Anion gap 10/23/2013 11  5 - 15 Final  . Prothrombin Time 10/23/2013 16.2* 11.6 - 15.2 seconds Final  . INR 10/23/2013 1.29  0.00 - 1.49 Final  . Sodium 10/23/2013 137  137 - 147 mEq/L Final  . Potassium 10/23/2013 5.0  3.7 - 5.3 mEq/L Final  . Chloride 10/23/2013 101  96 - 112 mEq/L Final  . CO2 10/23/2013 26  19 - 32 mEq/L Final  . Glucose, Bld 10/23/2013 174* 70 - 99 mg/dL Final  . BUN 10/23/2013 26* 6 - 23 mg/dL Final  . Creatinine, Ser 10/23/2013 1.09  0.50 - 1.35 mg/dL Final  . Calcium 10/23/2013 8.7  8.4 - 10.5 mg/dL Final  . GFR calc non Af Amer 10/23/2013 65* >90 mL/min Final  . GFR calc Af Amer 10/23/2013 76* >90 mL/min Final   Comment: (NOTE)                          The eGFR has been calculated using the CKD EPI equation.  This calculation has not been validated in all clinical situations.                          eGFR's persistently <90 mL/min signify possible Chronic Kidney                          Disease.  Georgiann Hahn gap 10/23/2013 10  5 - 15 Final  Hospital Outpatient Visit on 10/14/2013  Component Date Value Ref Range Status  . aPTT 10/14/2013 51* 24 - 37 seconds Final   Comment:                                 IF BASELINE aPTT IS ELEVATED,                          SUGGEST PATIENT RISK ASSESSMENT                          BE USED TO DETERMINE APPROPRIATE                          ANTICOAGULANT THERAPY.  . WBC 10/14/2013 9.8  4.0 -  10.5 K/uL Final  . RBC 10/14/2013 4.87  4.22 - 5.81 MIL/uL Final  . Hemoglobin 10/14/2013 15.4  13.0 - 17.0 g/dL Final  . HCT 10/14/2013 45.7  39.0 - 52.0 % Final  . MCV 10/14/2013 93.8  78.0 - 100.0 fL Final  . MCH 10/14/2013 31.6  26.0 - 34.0 pg Final  . MCHC 10/14/2013 33.7  30.0 - 36.0 g/dL Final  . RDW 10/14/2013 13.7  11.5 - 15.5 % Final  . Platelets 10/14/2013 194  150 - 400 K/uL Final  . Sodium 10/14/2013 138  137 - 147 mEq/L Final  . Potassium 10/14/2013 4.8  3.7 - 5.3 mEq/L Final  . Chloride 10/14/2013 101  96 - 112 mEq/L Final  . CO2 10/14/2013 25  19 - 32 mEq/L Final  . Glucose, Bld 10/14/2013 90  70 - 99 mg/dL Final  . BUN 10/14/2013 24* 6 - 23 mg/dL Final  . Creatinine, Ser 10/14/2013 1.29  0.50 - 1.35 mg/dL Final  . Calcium 10/14/2013 9.7  8.4 - 10.5 mg/dL Final  . Total Protein 10/14/2013 7.6  6.0 - 8.3 g/dL Final  . Albumin 10/14/2013 3.6  3.5 - 5.2 g/dL Final  . AST 10/14/2013 26  0 - 37 U/L Final  . ALT 10/14/2013 28  0 - 53 U/L Final  . Alkaline Phosphatase 10/14/2013 55  39 - 117 U/L Final  . Total Bilirubin 10/14/2013 0.5  0.3 - 1.2 mg/dL Final  . GFR calc non Af Amer 10/14/2013 53* >90 mL/min Final  . GFR calc Af Amer 10/14/2013 62* >90 mL/min Final   Comment: (NOTE)                          The eGFR has been calculated using the CKD EPI equation.                          This calculation has not been validated in all clinical situations.  eGFR's persistently <90 mL/min signify possible Chronic Kidney                          Disease.  . Anion gap 10/14/2013 12  5 - 15 Final  . Prothrombin Time 10/14/2013 26.6* 11.6 - 15.2 seconds Final  . INR 10/14/2013 2.45* 0.00 - 1.49 Final  . ABO/RH(D) 10/14/2013 A POS   Final  . Antibody Screen 10/14/2013 NEG   Final  . Sample Expiration 10/14/2013 10/20/2013   Final  . Color, Urine 10/14/2013 YELLOW  YELLOW Final  . APPearance 10/14/2013 CLOUDY* CLEAR Final  . Specific Gravity, Urine  10/14/2013 1.021  1.005 - 1.030 Final  . pH 10/14/2013 6.0  5.0 - 8.0 Final  . Glucose, UA 10/14/2013 NEGATIVE  NEGATIVE mg/dL Final  . Hgb urine dipstick 10/14/2013 SMALL* NEGATIVE Final  . Bilirubin Urine 10/14/2013 NEGATIVE  NEGATIVE Final  . Ketones, ur 10/14/2013 NEGATIVE  NEGATIVE mg/dL Final  . Protein, ur 10/14/2013 NEGATIVE  NEGATIVE mg/dL Final  . Urobilinogen, UA 10/14/2013 1.0  0.0 - 1.0 mg/dL Final  . Nitrite 10/14/2013 NEGATIVE  NEGATIVE Final  . Leukocytes, UA 10/14/2013 SMALL* NEGATIVE Final  . MRSA, PCR 10/14/2013 NEGATIVE  NEGATIVE Final  . Staphylococcus aureus 10/14/2013 NEGATIVE  NEGATIVE Final   Comment:                                 The Xpert SA Assay (FDA                          approved for NASAL specimens                          in patients over 40 years of age),                          is one component of                          a comprehensive surveillance                          program.  Test performance has                          been validated by [employer] for patients greater                          than or equal to 24 year old.                          It is not intended                          to diagnose infection nor to                          guide or monitor treatment.  . Squamous Epithelial / LPF 10/14/2013 RARE  RARE Final  . WBC, UA 10/14/2013 7-10  <3 WBC/hpf Final  . RBC / HPF 10/14/2013 3-6  <3 RBC/hpf Final  . ABO/RH(D) 10/14/2013 A POS   Final     X-Rays:Dg Chest 2 View  10/14/2013   CLINICAL DATA:  Preoperative knee replacement ; hypertension  EXAM: CHEST  2 VIEW  COMPARISON:  April 26, 2009  FINDINGS: There is no edema or consolidation. Heart is upper normal in size with pulmonary vascularity within normal limits. No adenopathy. Patient is status post internal mammary bypass grafting. There is degenerative change in the thoracic spine.  IMPRESSION: No edema or consolidation.   Electronically Signed    By: Lowella Grip M.D.   On: 10/14/2013 14:51    EKG: Orders placed during the hospital encounter of 10/14/13  . EKG 12-LEAD  . EKG 12-LEAD     Hospital Course: [name] [name] is a 73 y.o. who was admitted to [facility]. They were brought to the operating room on 10/21/2013 and underwent Procedure(s): RIGHT TOTAL KNEE ARTHROPLASTY.  Patient tolerated the procedure well and was later transferred to the recovery room and then to the orthopaedic floor for postoperative care.  They were given PO and IV analgesics for pain control following their surgery.  They were given 24 hours of postoperative antibiotics of  Anti-infectives   Start     Dose/Rate Route Frequency Ordered Stop   10/21/13 1430  ceFAZolin (ANCEF) IVPB 2 g/50 mL premix     2 g 100 mL/hr over 30 Minutes Intravenous Every 6 hours 10/21/13 1056 10/21/13 2112   10/21/13 0512  ceFAZolin (ANCEF) IVPB 2 g/50 mL premix  Status:  Discontinued     2 g 100 mL/hr over 30 Minutes Intravenous On call to O.R. 10/21/13 0263 10/21/13 1054     and started on DVT prophylaxis in the form of Lovenox and Coumadin.   PT and OT were ordered for total joint protocol.  Discharge planning consulted to help with postop disposition and equipment needs.  Patient had a good night on the evening of surgery walking 100 feet and already able to do SLR's.  They started to get up OOB with therapy on day one again. Hemovac drain was pulled without difficulty.  Continued to work with therapy into day two.  Dressing was changed on day two and the incision was healing well.  Potassium was elevated on morning labs on day two but was recheck and was back to normal.  Patient was seen in rounds and was ready to go home.  Discharge home with home health  Diet - Cardiac diet  Follow up - in 2 weeks  Activity - WBAT  Disposition - Home  Condition Upon Discharge - Good  D/C Meds - See DC Summary  DVT Prophylaxis - Lovenox and Coumadin, Lovenox injections  until Coumadin back into a therapeutic level.      Discharge Instructions   Call MD / Call 911    Complete by:  As directed   If you experience chest pain or shortness of breath, CALL 911 and be transported to the hospital emergency room.  If you develope a fever above 101 F, pus (white drainage) or increased drainage or redness at the wound, or calf pain, call your surgeon's office.     Change dressing    Complete by:  As directed   Change dressing daily with sterile 4 x 4 inch gauze dressing and apply TED hose. Do not submerge the incision  under water.     Constipation Prevention    Complete by:  As directed   Drink plenty of fluids.  Prune juice may be helpful.  You may use a stool softener, such as Colace (over the counter) 100 mg twice a day.  Use MiraLax (over the counter) for constipation as needed.     Diet - low sodium heart healthy    Complete by:  As directed      Discharge instructions    Complete by:  As directed   Pick up stool softner and laxative for home. Do not submerge incision under water. May shower. Continue to use ice for pain and swelling from surgery.  Take Coumadin for three weeks for postoperative protocol and then the patient may resume their previous Coumadin home regimen.  The dose may need to be adjusted based upon the INR.  Please follow the INR and titrate Coumadin dose for a therapeutic range between 2.0 and 3.0 INR.  After completing the three weeks of Coumadin, the patient may resume their previous Coumadin home regimen.  Continue Lovenox injections until the INR is therapeutic at or greater than 2.0.  When INR reaches the therapeutic level of equal to or greater than 2.0, the patient may discontinue the Lovenox injections.     Do not put a pillow under the knee. Place it under the heel.    Complete by:  As directed      Do not sit on low chairs, stoools or toilet seats, as it may be difficult to get up from low surfaces    Complete by:  As directed       Driving restrictions    Complete by:  As directed   No driving until released by the physician.     Increase activity slowly as tolerated    Complete by:  As directed      Lifting restrictions    Complete by:  As directed   No lifting until released by the physician.     Patient may shower    Complete by:  As directed   You may shower without a dressing once there is no drainage.  Do not wash over the wound.  If drainage remains, do not shower until drainage stops.     TED hose    Complete by:  As directed   Use stockings (TED hose) for 3 weeks on both leg(s).  You may remove them at night for sleeping.     Weight bearing as tolerated    Complete by:  As directed             Medication List    STOP taking these medications       aspirin 325 MG tablet     multivitamin with minerals Tabs tablet     sildenafil 100 MG tablet  Commonly known as:  VIAGRA      TAKE these medications       acetaminophen 325 MG tablet  Commonly known as:  TYLENOL  Take 650 mg by mouth every evening.     bisacodyl 10 MG suppository  Commonly known as:  DULCOLAX  Place 1 suppository (10 mg total) rectally daily as needed for moderate constipation.     DSS 100 MG Caps  Take 100 mg by mouth 2 (two) times daily.     enoxaparin 40 MG/0.4ML injection  Commonly known as:  LOVENOX  Inject 0.4 mLs (40 mg total) into the skin daily. Continue Lovenox injections  until the INR is therapeutic at or greater than 2.0.  When INR reaches the therapeutic level of equal to or greater than 2.0, the patient may discontinue the Lovenox injections.     ezetimibe 10 MG tablet  Commonly known as:  ZETIA  Take 10 mg by mouth every morning.     levothyroxine 50 MCG tablet  Commonly known as:  SYNTHROID, LEVOTHROID  Take 50 mcg by mouth every morning.     losartan 100 MG tablet  Commonly known as:  COZAAR  Take 100 mg by mouth every morning.     methocarbamol 500 MG tablet  Commonly known as:  ROBAXIN    Take 1 tablet (500 mg total) by mouth every 6 (six) hours as needed for muscle spasms.     metoprolol 50 MG tablet  Commonly known as:  LOPRESSOR  Take 50 mg by mouth every morning.     ondansetron 4 MG tablet  Commonly known as:  ZOFRAN  Take 1 tablet (4 mg total) by mouth every 6 (six) hours as needed for nausea.     oxyCODONE 5 MG immediate release tablet  Commonly known as:  Oxy IR/ROXICODONE  Take 1-2 tablets (5-10 mg total) by mouth every 3 (three) hours as needed for moderate pain, severe pain or breakthrough pain.     polyethylene glycol packet  Commonly known as:  MIRALAX / GLYCOLAX  Take 17 g by mouth daily as needed for mild constipation.     traMADol 50 MG tablet  Commonly known as:  ULTRAM  Take 1-2 tablets (50-100 mg total) by mouth every 6 (six) hours as needed for moderate pain.     warfarin 5 MG tablet  Commonly known as:  COUMADIN  Take 1 tablet (5 mg total) by mouth every morning. Take Coumadin for three weeks for postoperative protocol and then the patient may resume their previous Coumadin home regimen.  The dose may need to be adjusted based upon the INR.  Please follow the INR and titrate Coumadin dose for a therapeutic range between 2.0 and 3.0 INR.  After completing the three weeks of Coumadin, the patient may resume their previous Coumadin home regimen.       Follow-up Information   Follow up with [facility]. (Home health physical therapy and nurse for INR monitoring)    Contact information:   [address]  [address] [phone]       Follow up with [name], MD. Schedule an appointment as soon as possible for a visit on 11/05/2013. (Call office at [phone] to set up follow up appointment.)    Specialty:  Orthopedic Surgery   Contact information:   [address] [place] [address] [phone]       Signed: Arlee Muslim, PA-C Orthopaedic Surgery 10/25/2013, 9:25 AM

## 2013-10-22 NOTE — Progress Notes (Signed)
ANTICOAGULATION CONSULT NOTE - Follow Up Consult  Pharmacy Consult for Warfarin Indication: VTE prophylaxis, chronic warfarin for h/o DVT/PE, Protein S deficiency  Allergies  Allergen Reactions  . Ace Inhibitors     Cramps, Weak muscles.   . Sulfa Antibiotics Hives    Patient Measurements: Height: 5' 8.5" (174 cm) Weight: 245 lb 3.2 oz (111.222 kg) IBW/kg (Calculated) : 69.55  Vital Signs: Temp: 97.8 F (36.6 C) (10/06 0611) Temp Source: Oral (10/06 0611) BP: 156/59 mmHg (10/06 1005) Pulse Rate: 62 (10/06 1005)  Labs:  Recent Labs  10/21/13 0705 10/22/13 0510  HGB  --  13.6  HCT  --  40.5  PLT  --  182  LABPROT 13.5 14.5  INR 1.02 1.12  CREATININE  --  1.10    Estimated Creatinine Clearance: 72.9 ml/min (by C-G formula based on Cr of 1.1).   Medical History: Past Medical History  Diagnosis Date  . Hypertension   . Peripheral vascular disease     hx phelbitis  . Arthritis   . Blood dyscrasia     protein S deficiency  . Tibia fracture     no surgery- got phlebitis at leg under cast   . DVT (deep venous thrombosis)     lungs, legs, arms at various times- Protein S deficiency    Assessment: 31 yoM on chronic warfarin PTA for history of DVT/PE and protein S deficiency.  Patient was instructed to hold warfarin 4 days prior to right TKA with Lovenox bridge.  Now s/p surgery and pharmacy consulted to resume warfarin postop.  Lovenox 30 mg IV q12h to begin 10/6 (AM of POD1) until INR >/= 1.8 per ortho.  Patient's home warfarin dose reported as 5 mg daily.   INR 1.12  CBC WNL  Ortho planning for discharge tomorrow  Goal of Therapy:  INR 2-3   Plan:  1.  Warfarin 7.5 mg po once tonight. 2.  Daily INR. 3.  Recommend resuming home dose of warfarin 5 mg daily at discharge with Lovenox bridge until INR therapeutic.  Hershal Coria 10/22/2013,10:24 AM

## 2013-10-22 NOTE — Progress Notes (Signed)
Physical Therapy Treatment Patient Details Name: [name] [name] MRN: [id number] DOB: 04/10/1940 Today's Date: 10/22/2013    History of Present Illness s/p [name]    PT Comments    POD # 1 pm session.  Asssited pt out of recliner to amb a second time.  Tolerated increased amb distance and less cueing on safety using RW.  Returned to recliner chair and re applied fresh ICE to knee.    Follow Up Recommendations  Home health PT     Equipment Recommendations  None recommended by PT    Recommendations for Other Services       Precautions / Restrictions Precautions Precautions: Fall;Knee Precaution Comments: able to do SLRs; did not use KI Restrictions Weight Bearing Restrictions: No Other Position/Activity Restrictions: WBAT    Mobility  Bed Mobility               General bed mobility comments: pt OOB in recliner  Transfers Overall transfer level: Needs assistance Equipment used: Rolling walker (2 wheeled) Transfers: Sit to/from Stand Sit to Stand: Min guard         General transfer comment: cues for UE/LE placement plus increased time  Ambulation/Gait Ambulation/Gait assistance: Min guard Ambulation Distance (Feet): 145 Feet Assistive device: Rolling walker (2 wheeled) Gait Pattern/deviations: Step-to pattern;Step-through pattern;Decreased stride length Gait velocity: decreased   General Gait Details: cues for sequence and RW position   Stairs            Wheelchair Mobility    Modified Rankin (Stroke Patients Only)       Balance                                    Cognition                            Exercises      General Comments        Pertinent Vitals/Pain      Home Living                      Prior Function            PT Goals (current goals can now be found in the care plan section) Progress towards PT goals: Progressing toward goals    Frequency  7X/week    PT Plan       Co-evaluation             End of Session Equipment Utilized During Treatment: Gait belt Activity Tolerance: Patient tolerated treatment well Patient left: with call bell/phone within reach;in chair     Time: 3545-6256 PT Time Calculation (min): 28 min  Charges:  $Gait Training: 8-22 mins $Therapeutic Activity: 8-22 mins                    G Codes:      Rica Koyanagi  PTA [facility]  Acute  Rehab Pager      [phone]

## 2013-10-22 NOTE — Progress Notes (Signed)
Physical Therapy Treatment Patient Details Name: Jeromiah Ohalloran MRN: [id number] DOB: [birth year] Today's Date: 10/22/2013    History of Present Illness s/p [name]    PT Comments    POD # 1 am session.  Pt already OOB in recliner.  Assisted with amb in hallway while monitoring O2 sats as OT reported decrease during her session.  RA ranged from 88 - 95% no c/o.  Assisted back to room then performed TKR TE's followed by ICE.  Follow Up Recommendations  Home health PT     Equipment Recommendations  None recommended by PT    Recommendations for Other Services       Precautions / Restrictions Precautions Precautions: Fall;Knee Precaution Comments: able to do SLRs; did not use KI Restrictions Weight Bearing Restrictions: No Other Position/Activity Restrictions: WBAT    Mobility  Bed Mobility               General bed mobility comments: pt OOB in recliner  Transfers Overall transfer level: Needs assistance Equipment used: Rolling walker (2 wheeled) Transfers: Sit to/from Stand Sit to Stand: Min guard         General transfer comment: cues for UE/LE placement plus increased time  Ambulation/Gait Ambulation/Gait assistance: Min guard Ambulation Distance (Feet): 110 Feet Assistive device: Rolling walker (2 wheeled) Gait Pattern/deviations: Step-to pattern;Step-through pattern;Decreased stride length Gait velocity: decreased   General Gait Details: cues for sequence and RW position   Stairs            Wheelchair Mobility    Modified Rankin (Stroke Patients Only)       Balance                                    Cognition                            Exercises   Total Knee Replacement TE's 10 reps B LE ankle pumps 10 reps towel squeezes 10 reps knee presses 10 reps heel slides  10 reps SAQ's 10 reps SLR's 10 reps ABD Followed by ICE     General Comments        Pertinent Vitals/Pain      Home Living                      Prior Function            PT Goals (current goals can now be found in the care plan section) Progress towards PT goals: Progressing toward goals    Frequency  7X/week    PT Plan      Co-evaluation             End of Session Equipment Utilized During Treatment: Gait belt Activity Tolerance: Patient tolerated treatment well Patient left: with call bell/phone within reach;in chair     Time: 1000-1025 PT Time Calculation (min): 25 min  Charges:  $Gait Training: 8-22 mins $Therapeutic Exercise: 8-22 mins                    G Codes:      Rica Koyanagi  PTA [facility]  Acute  Rehab Pager      [phone]

## 2013-10-22 NOTE — Evaluation (Signed)
Occupational Therapy Evaluation Patient Details Name: [name] [name] MRN: [id number] DOB: 1940-06-15 Today's Date: 10/22/2013    History of Present Illness s/p [name]   Clinical Impression   This 73 year old man was admitted for the above surgery.  His wife is comfortable assisting him at home.  Seen for initial evaluation and educated him on bathroom transfers.  Wife verbalizes that she is comfortable with adls.  No further OT is needed at this time.      Follow Up Recommendations  No OT follow up    Equipment Recommendations  None recommended by OT    Recommendations for Other Services       Precautions / Restrictions Precautions Precautions: Fall;Knee Precaution Comments: able to do SLRs; did not use KI Restrictions Weight Bearing Restrictions: No      Mobility Bed Mobility         Supine to sit: Modified independent (Device/Increase time)     General bed mobility comments: HOB raised  Transfers   Equipment used: Rolling walker (2 wheeled) Transfers: Sit to/from Stand Sit to Stand: Min guard         General transfer comment: cues for UE/LE placement    Balance                                            ADL Overall ADL's : Needs assistance/impaired                         Toilet Transfer: Min guard;Ambulation       Tub/ Shower Transfer: Walk-in shower;Min guard;Ambulation     General ADL Comments: practiced shower transfer and simulated toilet (using recliner).  Wife had assisted with adl prior to my arrival and she will help him at home.  She had stepped out of the room; I explained sequence when she came back in     Vision                     Perception     Praxis      Pertinent Vitals/Pain Pain Assessment: No/denies pain     Hand Dominance     Extremity/Trunk Assessment Upper Extremity Assessment Upper Extremity Assessment: Overall WFL for tasks assessed           Communication  Communication Communication: No difficulties   Cognition Arousal/Alertness: Awake/alert Behavior During Therapy: WFL for tasks assessed/performed Overall Cognitive Status: Within Functional Limits for tasks assessed                     General Comments       Exercises       Shoulder Instructions      Home Living Family/patient expects to be discharged to:: Private residence Living Arrangements: Spouse/significant other                 Bathroom Shower/Tub: Hospital doctor Toilet: Handicapped height     Home Equipment: Environmental consultant - 2 wheels;Bedside commode;Wheelchair - manual   Additional Comments: pt has 2 stories; full baths on both      Prior Functioning/Environment Level of Independence: Independent             OT Diagnosis: Generalized weakness   OT Problem List:     OT Treatment/Interventions:      OT Goals(Current goals  can be found in the care plan section)    OT Frequency:     Barriers to D/C:            Co-evaluation              End of Session CPM Right Knee CPM Right Knee: Off  Activity Tolerance: Patient tolerated treatment well Patient left: in chair;with call bell/phone within reach;with family/visitor present   Time: 0903-0928 OT Time Calculation (min): 25 min Charges:  OT General Charges $OT Visit: 1 Procedure OT Evaluation $Initial OT Evaluation Tier I: 1 Procedure OT Treatments $Self Care/Home Management : 8-22 mins G-Codes:    Ceriah Kohler 10-27-13, 9:35 AM  Lesle Chris, OTR/L [phone] October 27, 2013

## 2013-10-23 LAB — BASIC METABOLIC PANEL
Anion gap: 11 (ref 5–15)
Anion gap: 10 (ref 5–15)
BUN: 27 mg/dL — AB (ref 6–23)
BUN: 26 mg/dL — AB (ref 6–23)
CHLORIDE: 102 meq/L (ref 96–112)
CO2: 22 mEq/L (ref 19–32)
CO2: 26 mEq/L (ref 19–32)
Calcium: 8.2 mg/dL — ABNORMAL LOW (ref 8.4–10.5)
Calcium: 8.7 mg/dL (ref 8.4–10.5)
Chloride: 101 mEq/L (ref 96–112)
Creatinine, Ser: 1.08 mg/dL (ref 0.50–1.35)
Creatinine, Ser: 1.09 mg/dL (ref 0.50–1.35)
GFR calc Af Amer: 76 mL/min — ABNORMAL LOW (ref >90)
GFR calc non Af Amer: 66 mL/min — ABNORMAL LOW (ref >90)
GFR, EST AFRICAN AMERICAN: 77 mL/min — AB (ref >90)
GFR, EST NON AFRICAN AMERICAN: 65 mL/min — AB (ref >90)
GLUCOSE: 174 mg/dL — AB (ref 70–99)
Glucose, Bld: 161 mg/dL — ABNORMAL HIGH (ref 70–99)
POTASSIUM: 5 meq/L (ref 3.7–5.3)
Potassium: 5.4 mEq/L — ABNORMAL HIGH (ref 3.7–5.3)
SODIUM: 135 meq/L — AB (ref 137–147)
Sodium: 137 mEq/L (ref 137–147)

## 2013-10-23 LAB — CBC
HEMATOCRIT: 39.8 % (ref 39.0–52.0)
HEMOGLOBIN: 13.1 g/dL (ref 13.0–17.0)
MCH: 31.7 pg (ref 26.0–34.0)
MCHC: 32.9 g/dL (ref 30.0–36.0)
MCV: 96.4 fL (ref 78.0–100.0)
Platelets: 175 10*3/uL (ref 150–400)
RBC: 4.13 MIL/uL — ABNORMAL LOW (ref 4.22–5.81)
RDW: 13.5 % (ref 11.5–15.5)
WBC: 18 10*3/uL — AB (ref 4.0–10.5)

## 2013-10-23 LAB — PROTIME-INR
INR: 1.29 (ref 0.00–1.49)
PROTHROMBIN TIME: 16.2 s — AB (ref 11.6–15.2)

## 2013-10-23 NOTE — Progress Notes (Signed)
Physical Therapy Treatment Patient Details Name: [name] [name] MRN: [id number] DOB: 1940/08/27 Today's Date: 10/23/2013    History of Present Illness s/p [name]    PT Comments    POD # 2 pt requested I see him 30 min after his 10 am pain meds.  On arrival getting dressed.  Assisted with amb in hallway then practiced going up 2 steps with 2 reachable rails.  Performed TKR TE's following handout.  Instructed on HEP and use of ICE.   Pt ready for D/C to home.   Follow Up Recommendations  Home health PT     Equipment Recommendations  None recommended by PT    Recommendations for Other Services       Precautions / Restrictions      Mobility  Bed Mobility               General bed mobility comments: pt OOB in recliner  Transfers Overall transfer level: Modified independent Equipment used: Rolling walker (2 wheeled) Transfers: Sit to/from Stand Sit to Stand: Modified independent (Device/Increase time)         General transfer comment: good use of hands and safety cognition  Ambulation/Gait Ambulation/Gait assistance: Modified independent (Device/Increase time) Ambulation Distance (Feet): 85 Feet Assistive device: Rolling walker (2 wheeled) Gait Pattern/deviations: Step-to pattern;Decreased stride length Gait velocity: decreased   General Gait Details: increased time   Stairs Stairs: Yes Stairs assistance: Min guard Stair Management: Two rails;Step to pattern;Forwards Number of Stairs: 2 General stair comments: with spouse present and 25% VC's on proper sequencing  Wheelchair Mobility    Modified Rankin (Stroke Patients Only)       Balance                                    Cognition                            Exercises      General Comments        Pertinent Vitals/Pain      Home Living                      Prior Function            PT Goals (current goals can now be found in the care plan  section) Progress towards PT goals: Progressing toward goals    Frequency  7X/week    PT Plan      Co-evaluation             End of Session Equipment Utilized During Treatment: Gait belt Activity Tolerance: Patient tolerated treatment well Patient left: in chair;with call bell/phone within reach;with family/visitor present     Time: 3810-1751 PT Time Calculation (min): 27 min  Charges:  $Gait Training: 8-22 mins $Therapeutic Exercise: 8-22 mins                    G Codes:      [name] [name]  PTA [facility]  Acute  Rehab Pager      [phone]

## 2013-10-23 NOTE — Progress Notes (Signed)
ANTICOAGULATION CONSULT NOTE - Follow Up Consult  Pharmacy Consult for Warfarin Indication: VTE prophylaxis, chronic warfarin for h/o DVT/PE, Protein S deficiency  Allergies  Allergen Reactions  . Ace Inhibitors     Cramps, Weak muscles.   . Sulfa Antibiotics Hives    Patient Measurements: Height: 5' 8.5" (174 cm) Weight: 245 lb 3.2 oz (111.222 kg) IBW/kg (Calculated) : 69.55  Vital Signs: Temp: 98.6 F (37 C) (10/07 0714) Temp Source: Oral (10/07 0714) BP: 166/79 mmHg (10/07 1005) Pulse Rate: 65 (10/07 1005)  Labs:  Recent Labs  10/21/13 0705 10/22/13 0510 10/23/13 0430 10/23/13 0944  HGB  --  13.6 13.1  --   HCT  --  40.5 39.8  --   PLT  --  182 175  --   LABPROT 13.5 14.5 16.2*  --   INR 1.02 1.12 1.29  --   CREATININE  --  1.10 1.08 1.09    Estimated Creatinine Clearance: 73.6 ml/min (by C-G formula based on Cr of 1.09).   Medical History: Past Medical History  Diagnosis Date  . Hypertension   . Peripheral vascular disease     hx phelbitis  . Arthritis   . Blood dyscrasia     protein S deficiency  . Tibia fracture     no surgery- got phlebitis at leg under cast   . DVT (deep venous thrombosis)     lungs, legs, arms at various times- Protein S deficiency    Assessment: 25 yoM on chronic warfarin PTA for history of DVT/PE and protein S deficiency.  Patient was instructed to hold warfarin 4 days prior to right TKA with Lovenox bridge.  Now s/p surgery and pharmacy consulted to resume warfarin postop.  Lovenox 30 mg IV q12h to begin 10/6 (AM of POD1) until INR >/= 1.8 per ortho.  Patient's home warfarin dose reported as 5 mg daily.   INR 1.29  CBC WNL  Ortho planning for discharge today  Goal of Therapy:  INR 2-3   Plan:  1. Recommend resuming home dose of warfarin 5 mg daily at discharge with Lovenox bridge until INR therapeutic.  Kizzie Furnish, PharmD Pager: [phone] 10/23/2013 11:48 AM

## 2013-10-23 NOTE — Progress Notes (Signed)
   Subjective: 2 Days Post-Op Procedure(s) (LRB): RIGHT TOTAL KNEE ARTHROPLASTY (Right) Patient reports pain as mild.   Patient seen in rounds with Dr. Wynelle Link. Potassium was elevated on morning labs but was recheck and was back to normal. Patient is well, but has had some minor complaints of pain in the knee, requiring pain medications Patient is ready to go home today  Objective: Vital signs in last 24 hours: Temp:  [98 F (36.7 C)-98.6 F (37 C)] 98.6 F (37 C) (10/07 0714) Pulse Rate:  [55-65] 65 (10/07 1005) Resp:  [14-18] 16 (10/07 1200) BP: (137-168)/(58-79) 166/79 mmHg (10/07 1005) SpO2:  [92 %-98 %] 92 % (10/07 1200)  Intake/Output from previous day:  Intake/Output Summary (Last 24 hours) at 10/23/13 1246 Last data filed at 10/23/13 1000  Gross per 24 hour  Intake   1820 ml  Output   2655 ml  Net   -835 ml    Intake/Output this shift: Total I/O In: 480 [P.O.:480] Out: 1300 [Urine:1300]  Labs:  Recent Labs  10/22/13 0510 10/23/13 0430  HGB 13.6 13.1    Recent Labs  10/22/13 0510 10/23/13 0430  WBC 17.4* 18.0*  RBC 4.28 4.13*  HCT 40.5 39.8  PLT 182 175    Recent Labs  10/23/13 0430 10/23/13 0944  NA 135* 137  K 5.4* 5.0  CL 102 101  CO2 22 26  BUN 27* 26*  CREATININE 1.08 1.09  GLUCOSE 161* 174*  CALCIUM 8.2* 8.7    Recent Labs  10/22/13 0510 10/23/13 0430  INR 1.12 1.29    EXAM: General - Patient is Alert, Appropriate and Oriented Extremity - Neurovascular intact Sensation intact distally Dorsiflexion/Plantar flexion intact Incision - clean, dry, no drainage, healing Motor Function - intact, moving foot and toes well on exam.   Assessment/Plan: 2 Days Post-Op Procedure(s) (LRB): RIGHT TOTAL KNEE ARTHROPLASTY (Right) Procedure(s) (LRB): RIGHT TOTAL KNEE ARTHROPLASTY (Right) Past Medical History  Diagnosis Date  . Hypertension   . Peripheral vascular disease     hx phelbitis  . Arthritis   . Blood dyscrasia    protein S deficiency  . Tibia fracture     no surgery- got phlebitis at leg under cast   . DVT (deep venous thrombosis)     lungs, legs, arms at various times- Protein S deficiency   Principal Problem:   OA (osteoarthritis) of knee  Estimated body mass index is 36.74 kg/(m^2) as calculated from the following:   Height as of this encounter: 5' 8.5" (1.74 m).   Weight as of this encounter: 111.222 kg (245 lb 3.2 oz). Up with therapy Discharge home with home health Diet - Cardiac diet Follow up - in 2 weeks Activity - WBAT Disposition - Home Condition Upon Discharge - Good D/C Meds - See DC Summary DVT Prophylaxis - Lovenox and Coumadin, Lovenox injections until Coumadin back into a therapeutic level.  Arlee Muslim, PA-C Orthopaedic Surgery 10/23/2013, 12:46 PM

## 2013-10-24 DIAGNOSIS — I739 Peripheral vascular disease, unspecified: Secondary | ICD-10-CM | POA: Diagnosis not present

## 2013-10-24 DIAGNOSIS — I10 Essential (primary) hypertension: Secondary | ICD-10-CM | POA: Diagnosis not present

## 2013-10-24 DIAGNOSIS — M1712 Unilateral primary osteoarthritis, left knee: Secondary | ICD-10-CM | POA: Diagnosis not present

## 2013-10-24 DIAGNOSIS — I2510 Atherosclerotic heart disease of native coronary artery without angina pectoris: Secondary | ICD-10-CM | POA: Diagnosis not present

## 2013-10-24 DIAGNOSIS — D6859 Other primary thrombophilia: Secondary | ICD-10-CM | POA: Diagnosis not present

## 2013-10-24 DIAGNOSIS — Z471 Aftercare following joint replacement surgery: Secondary | ICD-10-CM | POA: Diagnosis not present

## 2013-10-27 DIAGNOSIS — I2510 Atherosclerotic heart disease of native coronary artery without angina pectoris: Secondary | ICD-10-CM | POA: Diagnosis not present

## 2013-10-27 DIAGNOSIS — I10 Essential (primary) hypertension: Secondary | ICD-10-CM | POA: Diagnosis not present

## 2013-10-27 DIAGNOSIS — M1712 Unilateral primary osteoarthritis, left knee: Secondary | ICD-10-CM | POA: Diagnosis not present

## 2013-10-27 DIAGNOSIS — Z471 Aftercare following joint replacement surgery: Secondary | ICD-10-CM | POA: Diagnosis not present

## 2013-10-27 DIAGNOSIS — I739 Peripheral vascular disease, unspecified: Secondary | ICD-10-CM | POA: Diagnosis not present

## 2013-10-27 DIAGNOSIS — D6859 Other primary thrombophilia: Secondary | ICD-10-CM | POA: Diagnosis not present

## 2013-10-28 DIAGNOSIS — I739 Peripheral vascular disease, unspecified: Secondary | ICD-10-CM | POA: Diagnosis not present

## 2013-10-28 DIAGNOSIS — D6859 Other primary thrombophilia: Secondary | ICD-10-CM | POA: Diagnosis not present

## 2013-10-28 DIAGNOSIS — Z471 Aftercare following joint replacement surgery: Secondary | ICD-10-CM | POA: Diagnosis not present

## 2013-10-28 DIAGNOSIS — I2510 Atherosclerotic heart disease of native coronary artery without angina pectoris: Secondary | ICD-10-CM | POA: Diagnosis not present

## 2013-10-28 DIAGNOSIS — M1712 Unilateral primary osteoarthritis, left knee: Secondary | ICD-10-CM | POA: Diagnosis not present

## 2013-10-28 DIAGNOSIS — I10 Essential (primary) hypertension: Secondary | ICD-10-CM | POA: Diagnosis not present

## 2013-10-29 DIAGNOSIS — I2510 Atherosclerotic heart disease of native coronary artery without angina pectoris: Secondary | ICD-10-CM | POA: Diagnosis not present

## 2013-10-29 DIAGNOSIS — D6859 Other primary thrombophilia: Secondary | ICD-10-CM | POA: Diagnosis not present

## 2013-10-29 DIAGNOSIS — M1712 Unilateral primary osteoarthritis, left knee: Secondary | ICD-10-CM | POA: Diagnosis not present

## 2013-10-29 DIAGNOSIS — I10 Essential (primary) hypertension: Secondary | ICD-10-CM | POA: Diagnosis not present

## 2013-10-29 DIAGNOSIS — Z471 Aftercare following joint replacement surgery: Secondary | ICD-10-CM | POA: Diagnosis not present

## 2013-10-29 DIAGNOSIS — I739 Peripheral vascular disease, unspecified: Secondary | ICD-10-CM | POA: Diagnosis not present

## 2013-10-31 DIAGNOSIS — I10 Essential (primary) hypertension: Secondary | ICD-10-CM | POA: Diagnosis not present

## 2013-10-31 DIAGNOSIS — M1712 Unilateral primary osteoarthritis, left knee: Secondary | ICD-10-CM | POA: Diagnosis not present

## 2013-10-31 DIAGNOSIS — I739 Peripheral vascular disease, unspecified: Secondary | ICD-10-CM | POA: Diagnosis not present

## 2013-10-31 DIAGNOSIS — D6859 Other primary thrombophilia: Secondary | ICD-10-CM | POA: Diagnosis not present

## 2013-10-31 DIAGNOSIS — I2510 Atherosclerotic heart disease of native coronary artery without angina pectoris: Secondary | ICD-10-CM | POA: Diagnosis not present

## 2013-10-31 DIAGNOSIS — Z471 Aftercare following joint replacement surgery: Secondary | ICD-10-CM | POA: Diagnosis not present

## 2013-11-04 DIAGNOSIS — I2510 Atherosclerotic heart disease of native coronary artery without angina pectoris: Secondary | ICD-10-CM | POA: Diagnosis not present

## 2013-11-04 DIAGNOSIS — I739 Peripheral vascular disease, unspecified: Secondary | ICD-10-CM | POA: Diagnosis not present

## 2013-11-04 DIAGNOSIS — D6859 Other primary thrombophilia: Secondary | ICD-10-CM | POA: Diagnosis not present

## 2013-11-04 DIAGNOSIS — Z471 Aftercare following joint replacement surgery: Secondary | ICD-10-CM | POA: Diagnosis not present

## 2013-11-04 DIAGNOSIS — M1712 Unilateral primary osteoarthritis, left knee: Secondary | ICD-10-CM | POA: Diagnosis not present

## 2013-11-04 DIAGNOSIS — I10 Essential (primary) hypertension: Secondary | ICD-10-CM | POA: Diagnosis not present

## 2013-11-06 DIAGNOSIS — M1712 Unilateral primary osteoarthritis, left knee: Secondary | ICD-10-CM | POA: Diagnosis not present

## 2013-11-06 DIAGNOSIS — D6859 Other primary thrombophilia: Secondary | ICD-10-CM | POA: Diagnosis not present

## 2013-11-06 DIAGNOSIS — I2510 Atherosclerotic heart disease of native coronary artery without angina pectoris: Secondary | ICD-10-CM | POA: Diagnosis not present

## 2013-11-06 DIAGNOSIS — I10 Essential (primary) hypertension: Secondary | ICD-10-CM | POA: Diagnosis not present

## 2013-11-06 DIAGNOSIS — Z471 Aftercare following joint replacement surgery: Secondary | ICD-10-CM | POA: Diagnosis not present

## 2013-11-06 DIAGNOSIS — I739 Peripheral vascular disease, unspecified: Secondary | ICD-10-CM | POA: Diagnosis not present

## 2013-11-07 DIAGNOSIS — D6859 Other primary thrombophilia: Secondary | ICD-10-CM | POA: Diagnosis not present

## 2013-11-07 DIAGNOSIS — Z471 Aftercare following joint replacement surgery: Secondary | ICD-10-CM | POA: Diagnosis not present

## 2013-11-07 DIAGNOSIS — M1712 Unilateral primary osteoarthritis, left knee: Secondary | ICD-10-CM | POA: Diagnosis not present

## 2013-11-07 DIAGNOSIS — I10 Essential (primary) hypertension: Secondary | ICD-10-CM | POA: Diagnosis not present

## 2013-11-07 DIAGNOSIS — I739 Peripheral vascular disease, unspecified: Secondary | ICD-10-CM | POA: Diagnosis not present

## 2013-11-07 DIAGNOSIS — I2510 Atherosclerotic heart disease of native coronary artery without angina pectoris: Secondary | ICD-10-CM | POA: Diagnosis not present

## 2013-11-12 DIAGNOSIS — Z471 Aftercare following joint replacement surgery: Secondary | ICD-10-CM | POA: Diagnosis not present

## 2013-11-12 DIAGNOSIS — Z96651 Presence of right artificial knee joint: Secondary | ICD-10-CM | POA: Diagnosis not present

## 2013-11-12 DIAGNOSIS — R2689 Other abnormalities of gait and mobility: Secondary | ICD-10-CM | POA: Diagnosis not present

## 2013-11-14 DIAGNOSIS — Z96651 Presence of right artificial knee joint: Secondary | ICD-10-CM | POA: Diagnosis not present

## 2013-11-14 DIAGNOSIS — Z471 Aftercare following joint replacement surgery: Secondary | ICD-10-CM | POA: Diagnosis not present

## 2013-11-14 DIAGNOSIS — R2689 Other abnormalities of gait and mobility: Secondary | ICD-10-CM | POA: Diagnosis not present

## 2013-11-15 DIAGNOSIS — E119 Type 2 diabetes mellitus without complications: Secondary | ICD-10-CM | POA: Diagnosis not present

## 2013-11-15 DIAGNOSIS — Z96651 Presence of right artificial knee joint: Secondary | ICD-10-CM | POA: Diagnosis not present

## 2013-11-15 DIAGNOSIS — I10 Essential (primary) hypertension: Secondary | ICD-10-CM | POA: Diagnosis not present

## 2013-11-15 DIAGNOSIS — R2689 Other abnormalities of gait and mobility: Secondary | ICD-10-CM | POA: Diagnosis not present

## 2013-11-15 DIAGNOSIS — E782 Mixed hyperlipidemia: Secondary | ICD-10-CM | POA: Diagnosis not present

## 2013-11-15 DIAGNOSIS — Z471 Aftercare following joint replacement surgery: Secondary | ICD-10-CM | POA: Diagnosis not present

## 2013-11-18 DIAGNOSIS — Z96651 Presence of right artificial knee joint: Secondary | ICD-10-CM | POA: Diagnosis not present

## 2013-11-18 DIAGNOSIS — Z471 Aftercare following joint replacement surgery: Secondary | ICD-10-CM | POA: Diagnosis not present

## 2013-11-18 DIAGNOSIS — R2689 Other abnormalities of gait and mobility: Secondary | ICD-10-CM | POA: Diagnosis not present

## 2013-11-20 DIAGNOSIS — Z471 Aftercare following joint replacement surgery: Secondary | ICD-10-CM | POA: Diagnosis not present

## 2013-11-20 DIAGNOSIS — I8290 Acute embolism and thrombosis of unspecified vein: Secondary | ICD-10-CM | POA: Diagnosis not present

## 2013-11-20 DIAGNOSIS — Z96651 Presence of right artificial knee joint: Secondary | ICD-10-CM | POA: Diagnosis not present

## 2013-11-20 DIAGNOSIS — R2689 Other abnormalities of gait and mobility: Secondary | ICD-10-CM | POA: Diagnosis not present

## 2013-11-22 DIAGNOSIS — Z1389 Encounter for screening for other disorder: Secondary | ICD-10-CM | POA: Diagnosis not present

## 2013-11-22 DIAGNOSIS — Z96651 Presence of right artificial knee joint: Secondary | ICD-10-CM | POA: Diagnosis not present

## 2013-11-22 DIAGNOSIS — E6609 Other obesity due to excess calories: Secondary | ICD-10-CM | POA: Diagnosis not present

## 2013-11-22 DIAGNOSIS — E8881 Metabolic syndrome: Secondary | ICD-10-CM | POA: Diagnosis not present

## 2013-11-22 DIAGNOSIS — Z9189 Other specified personal risk factors, not elsewhere classified: Secondary | ICD-10-CM | POA: Diagnosis not present

## 2013-11-22 DIAGNOSIS — Z471 Aftercare following joint replacement surgery: Secondary | ICD-10-CM | POA: Diagnosis not present

## 2013-11-22 DIAGNOSIS — G6289 Other specified polyneuropathies: Secondary | ICD-10-CM | POA: Diagnosis not present

## 2013-11-22 DIAGNOSIS — R2689 Other abnormalities of gait and mobility: Secondary | ICD-10-CM | POA: Diagnosis not present

## 2013-11-22 DIAGNOSIS — E782 Mixed hyperlipidemia: Secondary | ICD-10-CM | POA: Diagnosis not present

## 2013-11-22 DIAGNOSIS — E039 Hypothyroidism, unspecified: Secondary | ICD-10-CM | POA: Diagnosis not present

## 2013-11-22 DIAGNOSIS — I10 Essential (primary) hypertension: Secondary | ICD-10-CM | POA: Diagnosis not present

## 2013-11-25 DIAGNOSIS — Z96651 Presence of right artificial knee joint: Secondary | ICD-10-CM | POA: Diagnosis not present

## 2013-11-25 DIAGNOSIS — Z471 Aftercare following joint replacement surgery: Secondary | ICD-10-CM | POA: Diagnosis not present

## 2013-11-25 DIAGNOSIS — R2689 Other abnormalities of gait and mobility: Secondary | ICD-10-CM | POA: Diagnosis not present

## 2013-11-26 DIAGNOSIS — Z471 Aftercare following joint replacement surgery: Secondary | ICD-10-CM | POA: Diagnosis not present

## 2013-11-26 DIAGNOSIS — M1712 Unilateral primary osteoarthritis, left knee: Secondary | ICD-10-CM | POA: Diagnosis not present

## 2013-11-26 DIAGNOSIS — Z96651 Presence of right artificial knee joint: Secondary | ICD-10-CM | POA: Diagnosis not present

## 2013-11-27 DIAGNOSIS — Z96651 Presence of right artificial knee joint: Secondary | ICD-10-CM | POA: Diagnosis not present

## 2013-11-27 DIAGNOSIS — Z471 Aftercare following joint replacement surgery: Secondary | ICD-10-CM | POA: Diagnosis not present

## 2013-11-27 DIAGNOSIS — R2689 Other abnormalities of gait and mobility: Secondary | ICD-10-CM | POA: Diagnosis not present

## 2013-11-29 DIAGNOSIS — Z471 Aftercare following joint replacement surgery: Secondary | ICD-10-CM | POA: Diagnosis not present

## 2013-11-29 DIAGNOSIS — Z96651 Presence of right artificial knee joint: Secondary | ICD-10-CM | POA: Diagnosis not present

## 2013-11-29 DIAGNOSIS — R2689 Other abnormalities of gait and mobility: Secondary | ICD-10-CM | POA: Diagnosis not present

## 2013-12-02 DIAGNOSIS — R2689 Other abnormalities of gait and mobility: Secondary | ICD-10-CM | POA: Diagnosis not present

## 2013-12-02 DIAGNOSIS — Z96651 Presence of right artificial knee joint: Secondary | ICD-10-CM | POA: Diagnosis not present

## 2013-12-02 DIAGNOSIS — Z471 Aftercare following joint replacement surgery: Secondary | ICD-10-CM | POA: Diagnosis not present

## 2013-12-04 DIAGNOSIS — Z96651 Presence of right artificial knee joint: Secondary | ICD-10-CM | POA: Diagnosis not present

## 2013-12-04 DIAGNOSIS — R2689 Other abnormalities of gait and mobility: Secondary | ICD-10-CM | POA: Diagnosis not present

## 2013-12-04 DIAGNOSIS — Z471 Aftercare following joint replacement surgery: Secondary | ICD-10-CM | POA: Diagnosis not present

## 2013-12-06 DIAGNOSIS — Z471 Aftercare following joint replacement surgery: Secondary | ICD-10-CM | POA: Diagnosis not present

## 2013-12-06 DIAGNOSIS — R2689 Other abnormalities of gait and mobility: Secondary | ICD-10-CM | POA: Diagnosis not present

## 2013-12-06 DIAGNOSIS — Z96651 Presence of right artificial knee joint: Secondary | ICD-10-CM | POA: Diagnosis not present

## 2013-12-25 DIAGNOSIS — I8290 Acute embolism and thrombosis of unspecified vein: Secondary | ICD-10-CM | POA: Diagnosis not present

## 2013-12-25 DIAGNOSIS — I2782 Chronic pulmonary embolism: Secondary | ICD-10-CM | POA: Diagnosis not present

## 2014-01-06 DIAGNOSIS — M1712 Unilateral primary osteoarthritis, left knee: Secondary | ICD-10-CM | POA: Diagnosis not present

## 2014-01-06 DIAGNOSIS — Z96651 Presence of right artificial knee joint: Secondary | ICD-10-CM | POA: Diagnosis not present

## 2014-01-06 DIAGNOSIS — Z471 Aftercare following joint replacement surgery: Secondary | ICD-10-CM | POA: Diagnosis not present

## 2014-01-16 DIAGNOSIS — H2513 Age-related nuclear cataract, bilateral: Secondary | ICD-10-CM | POA: Diagnosis not present

## 2014-01-29 DIAGNOSIS — M1712 Unilateral primary osteoarthritis, left knee: Secondary | ICD-10-CM | POA: Diagnosis not present

## 2014-02-05 DIAGNOSIS — I8290 Acute embolism and thrombosis of unspecified vein: Secondary | ICD-10-CM | POA: Diagnosis not present

## 2014-02-21 ENCOUNTER — Ambulatory Visit: Payer: Self-pay | Admitting: Orthopedic Surgery

## 2014-02-21 NOTE — Progress Notes (Signed)
Preoperative surgical orders have been place into the Epic hospital system for [facility] on 02/21/2014, 11:11 AM  by Mickel Crow for surgery on 03-10-2014.  Preop Total Knee orders including Experal, IV Tylenol, and IV Decadron as long as there are no contraindications to the above medications. Arlee Muslim, PA-C

## 2014-02-27 ENCOUNTER — Other Ambulatory Visit (HOSPITAL_COMMUNITY): Payer: Self-pay | Admitting: *Deleted

## 2014-02-27 NOTE — Patient Instructions (Signed)
[name] [name]  02/27/2014   Your procedure is scheduled on: Monday February 22nd, 2016  Report to [facility] Main  Entrance and follow signs to               [place] at 515 AM.  Call this number if you have problems the morning of surgery [phone]   Remember:  Do not eat food or drink liquids :After Midnight.     Take these medicines the morning of surgery with A SIP OF WATER:                                You may not have any metal on your body including hair pins and              piercings  Do not wear jewelry, make-up, lotions, powders or perfumes.             Do not wear nail polish.  Do not shave  48 hours prior to surgery.              Men may shave face and neck.   Do not bring valuables to the hospital. [place].  Contacts, dentures or bridgework may not be worn into surgery.  Leave suitcase in the car. After surgery it may be brought to your room.     Patients discharged the day of surgery will not be allowed to drive home.  Name and phone number of your driver:  Special Instructions: N/A              Please read over the following fact sheets you were given: _____________________________________________________________________             [facility] - Preparing for Surgery Before surgery, you can play an important role.  Because skin is not sterile, your skin needs to be as free of germs as possible.  You can reduce the number of germs on your skin by washing with CHG (chlorahexidine gluconate) soap before surgery.  CHG is an antiseptic cleaner which kills germs and bonds with the skin to continue killing germs even after washing. Please DO NOT use if you have an allergy to CHG or antibacterial soaps.  If your skin becomes reddened/irritated stop using the CHG and inform your nurse when you arrive at Short Stay. Do not shave (including legs and underarms) for at least 48 hours  prior to the first CHG shower.  You may shave your face/neck. Please follow these instructions carefully:  1.  Shower with CHG Soap the night before surgery and the  morning of Surgery.  2.  If you choose to wash your hair, wash your hair first as usual with your  normal  shampoo.  3.  After you shampoo, rinse your hair and body thoroughly to remove the  shampoo.                           4.  Use CHG as you would any other liquid soap.  You can apply chg directly  to the skin and wash  Gently with a scrungie or clean washcloth.  5.  Apply the CHG Soap to your body ONLY FROM THE NECK DOWN.   Do not use on face/ open                           Wound or open sores. Avoid contact with eyes, ears mouth and genitals (private parts).                       Wash face,  Genitals (private parts) with your normal soap.             6.  Wash thoroughly, paying special attention to the area where your surgery  will be performed.  7.  Thoroughly rinse your body with warm water from the neck down.  8.  DO NOT shower/wash with your normal soap after using and rinsing off  the CHG Soap.                9.  Pat yourself dry with a clean towel.            10.  Wear clean pajamas.            11.  Place clean sheets on your bed the night of your first shower and do not  sleep with pets. Day of Surgery : Do not apply any lotions/deodorants the morning of surgery.  Please wear clean clothes to the hospital/surgery center.  FAILURE TO FOLLOW THESE INSTRUCTIONS MAY RESULT IN THE CANCELLATION OF YOUR SURGERY PATIENT SIGNATURE_________________________________  NURSE SIGNATURE__________________________________  ________________________________________________________________________   Adam Phenix  An incentive spirometer is a tool that can help keep your lungs clear and active. This tool measures how well you are filling your lungs with each breath. Taking long deep breaths may help reverse  or decrease the chance of developing breathing (pulmonary) problems (especially infection) following:  A long period of time when you are unable to move or be active. BEFORE THE PROCEDURE   If the spirometer includes an indicator to show your best effort, your nurse or respiratory therapist will set it to a desired goal.  If possible, sit up straight or lean slightly forward. Try not to slouch.  Hold the incentive spirometer in an upright position. INSTRUCTIONS FOR USE   Sit on the edge of your bed if possible, or sit up as far as you can in bed or on a chair.  Hold the incentive spirometer in an upright position.  Breathe out normally.  Place the mouthpiece in your mouth and seal your lips tightly around it.  Breathe in slowly and as deeply as possible, raising the piston or the ball toward the top of the column.  Hold your breath for 3-5 seconds or for as long as possible. Allow the piston or ball to fall to the bottom of the column.  Remove the mouthpiece from your mouth and breathe out normally.  Rest for a few seconds and repeat Steps 1 through 7 at least 10 times every 1-2 hours when you are awake. Take your time and take a few normal breaths between deep breaths.  The spirometer may include an indicator to show your best effort. Use the indicator as a goal to work toward during each repetition.  After each set of 10 deep breaths, practice coughing to be sure your lungs are clear. If you have an incision (the cut made at the time of surgery),  support your incision when coughing by placing a pillow or rolled up towels firmly against it. Once you are able to get out of bed, walk around indoors and cough well. You may stop using the incentive spirometer when instructed by your caregiver.  RISKS AND COMPLICATIONS  Take your time so you do not get dizzy or light-headed.  If you are in pain, you may need to take or ask for pain medication before doing incentive spirometry. It is  harder to take a deep breath if you are having pain. AFTER USE  Rest and breathe slowly and easily.  It can be helpful to keep track of a log of your progress. Your caregiver can provide you with a simple table to help with this. If you are using the spirometer at home, follow these instructions: [place] IF:   You are having difficultly using the spirometer.  You have trouble using the spirometer as often as instructed.  Your pain medication is not giving enough relief while using the spirometer.  You develop fever of 100.5 F (38.1 C) or higher. SEEK IMMEDIATE MEDICAL CARE IF:   You cough up bloody sputum that had not been present before.  You develop fever of 102 F (38.9 C) or greater.  You develop worsening pain at or near the incision site. MAKE SURE YOU:   Understand these instructions.  Will watch your condition.  Will get help right away if you are not doing well or get worse. Document Released: 05/16/2006 Document Revised: 03/28/2011 Document Reviewed: 07/17/2006 Exit[employer] Patient Information 2014 [employer], [place].   ________________________________________________________________________  WHAT IS A BLOOD TRANSFUSION? Blood Transfusion Information  A transfusion is the replacement of blood or some of its parts. Blood is made up of multiple cells which provide different functions.  Red blood cells carry oxygen and are used for blood loss replacement.  White blood cells fight against infection.  Platelets control bleeding.  Plasma helps clot blood.  Other blood products are available for specialized needs, such as hemophilia or other clotting disorders. BEFORE THE TRANSFUSION  Who gives blood for transfusions?   Healthy volunteers who are fully evaluated to make sure their blood is safe. This is blood bank blood. Transfusion therapy is the safest it has ever been in the practice of medicine. Before blood is taken from a donor, a complete history  is taken to make sure that person has no history of diseases nor engages in risky social behavior (examples are intravenous drug use or sexual activity with multiple partners). The donor's travel history is screened to minimize risk of transmitting infections, such as malaria. The donated blood is tested for signs of infectious diseases, such as HIV and hepatitis. The blood is then tested to be sure it is compatible with you in order to minimize the chance of a transfusion reaction. If you or a relative donates blood, this is often done in anticipation of surgery and is not appropriate for emergency situations. It takes many days to process the donated blood. RISKS AND COMPLICATIONS Although transfusion therapy is very safe and saves many lives, the main dangers of transfusion include:   Getting an infectious disease.  Developing a transfusion reaction. This is an allergic reaction to something in the blood you were given. Every precaution is taken to prevent this. The decision to have a blood transfusion has been considered carefully by your caregiver before blood is given. Blood is not given unless the benefits outweigh the risks. AFTER THE TRANSFUSION  Right after receiving a blood transfusion, you will usually feel much better and more energetic. This is especially true if your red blood cells have gotten low (anemic). The transfusion raises the level of the red blood cells which carry oxygen, and this usually causes an energy increase.  The nurse administering the transfusion will monitor you carefully for complications. HOME CARE INSTRUCTIONS  No special instructions are needed after a transfusion. You may find your energy is better. Speak with your caregiver about any limitations on activity for underlying diseases you may have. SEEK MEDICAL CARE IF:   Your condition is not improving after your transfusion.  You develop redness or irritation at the intravenous (IV) site. SEEK IMMEDIATE  MEDICAL CARE IF:  Any of the following symptoms occur over the next 12 hours:  Shaking chills.  You have a temperature by mouth above 102 F (38.9 C), not controlled by medicine.  Chest, back, or muscle pain.  People around you feel you are not acting correctly or are confused.  Shortness of breath or difficulty breathing.  Dizziness and fainting.  You get a rash or develop hives.  You have a decrease in urine output.  Your urine turns a dark color or changes to pink, red, or brown. Any of the following symptoms occur over the next 10 days:  You have a temperature by mouth above 102 F (38.9 C), not controlled by medicine.  Shortness of breath.  Weakness after normal activity.  The white part of the eye turns yellow (jaundice).  You have a decrease in the amount of urine or are urinating less often.  Your urine turns a dark color or changes to pink, red, or brown. Document Released: 01/01/2000 Document Revised: 03/28/2011 Document Reviewed: 08/20/2007 [facility] Patient Information 2014 [place], [place].  _______________________________________________________________________

## 2014-02-27 NOTE — Progress Notes (Signed)
ekg 10-14-13 epic  chest xray 10-14-13 epic

## 2014-03-03 ENCOUNTER — Inpatient Hospital Stay (HOSPITAL_COMMUNITY)
Admission: RE | Admit: 2014-03-03 | Discharge: 2014-03-03 | Disposition: A | Discharge status: Home / Self Care | Payer: Medicare Other | Source: Ambulatory Visit

## 2014-03-04 NOTE — Patient Instructions (Addendum)
[name] [name]  03/04/2014   Your procedure is scheduled on: 03/10/2014    Report to [facility] Main  Entrance and follow signs to               [place] at      [place] AM.  Call this number if you have problems the morning of surgery [phone]   Remember:  Do not eat food or drink liquids :After Midnight.     Take these medicines the morning of surgery with A SIP OF WATER:  Metoprolol ( Toprol), Synthroid                                You may not have any metal on your body including hair pins and              piercings  Do not wear jewelry,  lotions, powders or perfumes., deodorant.                Men may shave face and neck.   Do not bring valuables to the hospital. [place].  Contacts, dentures or bridgework may not be worn into surgery.  Leave suitcase in the car. After surgery it may be brought to your room.      Special Instructions:coughing and deep breathing exercises, leg exercises               Please read over the following fact sheets you were given: _____________________________________________________________________             [facility] - Preparing for Surgery Before surgery, you can play an important role.  Because skin is not sterile, your skin needs to be as free of germs as possible.  You can reduce the number of germs on your skin by washing with CHG (chlorahexidine gluconate) soap before surgery.  CHG is an antiseptic cleaner which kills germs and bonds with the skin to continue killing germs even after washing. Please DO NOT use if you have an allergy to CHG or antibacterial soaps.  If your skin becomes reddened/irritated stop using the CHG and inform your nurse when you arrive at Short Stay. Do not shave (including legs and underarms) for at least 48 hours prior to the first CHG shower.  You may shave your face/neck. Please follow these instructions carefully:  1.   Shower with CHG Soap the night before surgery and the  morning of Surgery.  2.  If you choose to wash your hair, wash your hair first as usual with your  normal  shampoo.  3.  After you shampoo, rinse your hair and body thoroughly to remove the  shampoo.                           4.  Use CHG as you would any other liquid soap.  You can apply chg directly  to the skin and wash                       Gently with a scrungie or clean washcloth.  5.  Apply the CHG Soap to your body ONLY FROM THE NECK DOWN.   Do not use on face/ open  Wound or open sores. Avoid contact with eyes, ears mouth and genitals (private parts).                       Wash face,  Genitals (private parts) with your normal soap.             6.  Wash thoroughly, paying special attention to the area where your surgery  will be performed.  7.  Thoroughly rinse your body with warm water from the neck down.  8.  DO NOT shower/wash with your normal soap after using and rinsing off  the CHG Soap.                9.  Pat yourself dry with a clean towel.            10.  Wear clean pajamas.            11.  Place clean sheets on your bed the night of your first shower and do not  sleep with pets. Day of Surgery : Do not apply any lotions/deodorants the morning of surgery.  Please wear clean clothes to the hospital/surgery center.  FAILURE TO FOLLOW THESE INSTRUCTIONS MAY RESULT IN THE CANCELLATION OF YOUR SURGERY PATIENT SIGNATURE_________________________________  NURSE SIGNATURE__________________________________  ________________________________________________________________________  WHAT IS A BLOOD TRANSFUSION? Blood Transfusion Information  A transfusion is the replacement of blood or some of its parts. Blood is made up of multiple cells which provide different functions.  Red blood cells carry oxygen and are used for blood loss replacement.  White blood cells fight against infection.  Platelets control  bleeding.  Plasma helps clot blood.  Other blood products are available for specialized needs, such as hemophilia or other clotting disorders. BEFORE THE TRANSFUSION  Who gives blood for transfusions?   Healthy volunteers who are fully evaluated to make sure their blood is safe. This is blood bank blood. Transfusion therapy is the safest it has ever been in the practice of medicine. Before blood is taken from a donor, a complete history is taken to make sure that person has no history of diseases nor engages in risky social behavior (examples are intravenous drug use or sexual activity with multiple partners). The donor's travel history is screened to minimize risk of transmitting infections, such as malaria. The donated blood is tested for signs of infectious diseases, such as HIV and hepatitis. The blood is then tested to be sure it is compatible with you in order to minimize the chance of a transfusion reaction. If you or a relative donates blood, this is often done in anticipation of surgery and is not appropriate for emergency situations. It takes many days to process the donated blood. RISKS AND COMPLICATIONS Although transfusion therapy is very safe and saves many lives, the main dangers of transfusion include:  1. Getting an infectious disease. 2. Developing a transfusion reaction. This is an allergic reaction to something in the blood you were given. Every precaution is taken to prevent this. The decision to have a blood transfusion has been considered carefully by your caregiver before blood is given. Blood is not given unless the benefits outweigh the risks. AFTER THE TRANSFUSION  Right after receiving a blood transfusion, you will usually feel much better and more energetic. This is especially true if your red blood cells have gotten low (anemic). The transfusion raises the level of the red blood cells which carry oxygen, and this usually causes an energy increase.  The  nurse  administering the transfusion will monitor you carefully for complications. HOME CARE INSTRUCTIONS  No special instructions are needed after a transfusion. You may find your energy is better. Speak with your caregiver about any limitations on activity for underlying diseases you may have. SEEK MEDICAL CARE IF:   Your condition is not improving after your transfusion.  You develop redness or irritation at the intravenous (IV) site. SEEK IMMEDIATE MEDICAL CARE IF:  Any of the following symptoms occur over the next 12 hours:  Shaking chills.  You have a temperature by mouth above 102 F (38.9 C), not controlled by medicine.  Chest, back, or muscle pain.  People around you feel you are not acting correctly or are confused.  Shortness of breath or difficulty breathing.  Dizziness and fainting.  You get a rash or develop hives.  You have a decrease in urine output.  Your urine turns a dark color or changes to pink, red, or brown. Any of the following symptoms occur over the next 10 days:  You have a temperature by mouth above 102 F (38.9 C), not controlled by medicine.  Shortness of breath.  Weakness after normal activity.  The white part of the eye turns yellow (jaundice).  You have a decrease in the amount of urine or are urinating less often.  Your urine turns a dark color or changes to pink, red, or brown. Document Released: 01/01/2000 Document Revised: 03/28/2011 Document Reviewed: 08/20/2007 ExitCare Patient Information 2014 [place].  _______________________________________________________________________  Incentive Spirometer  An incentive spirometer is a tool that can help keep your lungs clear and active. This tool measures how well you are filling your lungs with each breath. Taking long deep breaths may help reverse or decrease the chance of developing breathing (pulmonary) problems (especially infection) following:  A long period of time when you are  unable to move or be active. BEFORE THE PROCEDURE   If the spirometer includes an indicator to show your best effort, your nurse or respiratory therapist will set it to a desired goal.  If possible, sit up straight or lean slightly forward. Try not to slouch.  Hold the incentive spirometer in an upright position. INSTRUCTIONS FOR USE  3. Sit on the edge of your bed if possible, or sit up as far as you can in bed or on a chair. 4. Hold the incentive spirometer in an upright position. 5. Breathe out normally. 6. Place the mouthpiece in your mouth and seal your lips tightly around it. 7. Breathe in slowly and as deeply as possible, raising the piston or the ball toward the top of the column. 8. Hold your breath for 3-5 seconds or for as long as possible. Allow the piston or ball to fall to the bottom of the column. 9. Remove the mouthpiece from your mouth and breathe out normally. 10. Rest for a few seconds and repeat Steps 1 through 7 at least 10 times every 1-2 hours when you are awake. Take your time and take a few normal breaths between deep breaths. 11. The spirometer may include an indicator to show your best effort. Use the indicator as a goal to work toward during each repetition. 12. After each set of 10 deep breaths, practice coughing to be sure your lungs are clear. If you have an incision (the cut made at the time of surgery), support your incision when coughing by placing a pillow or rolled up towels firmly against it. Once you are able to get  out of bed, walk around indoors and cough well. You may stop using the incentive spirometer when instructed by your caregiver.  RISKS AND COMPLICATIONS  Take your time so you do not get dizzy or light-headed.  If you are in pain, you may need to take or ask for pain medication before doing incentive spirometry. It is harder to take a deep breath if you are having pain. AFTER USE  Rest and breathe slowly and easily.  It can be helpful to  keep track of a log of your progress. Your caregiver can provide you with a simple table to help with this. If you are using the spirometer at home, follow these instructions: [place] IF:   You are having difficultly using the spirometer.  You have trouble using the spirometer as often as instructed.  Your pain medication is not giving enough relief while using the spirometer.  You develop fever of 100.5 F (38.1 C) or higher. SEEK IMMEDIATE MEDICAL CARE IF:   You cough up bloody sputum that had not been present before.  You develop fever of 102 F (38.9 C) or greater.  You develop worsening pain at or near the incision site. MAKE SURE YOU:   Understand these instructions.  Will watch your condition.  Will get help right away if you are not doing well or get worse. Document Released: 05/16/2006 Document Revised: 03/28/2011 Document Reviewed: 07/17/2006 [facility] Patient Information 2014 [place], [place].   ________________________________________________________________________

## 2014-03-05 ENCOUNTER — Encounter (HOSPITAL_COMMUNITY)
Admission: RE | Admit: 2014-03-05 | Discharge: 2014-03-05 | Disposition: A | Discharge status: Home / Self Care | Payer: Medicare Other | Source: Ambulatory Visit | Attending: Orthopedic Surgery | Admitting: Orthopedic Surgery

## 2014-03-05 ENCOUNTER — Encounter (HOSPITAL_COMMUNITY): Payer: Self-pay

## 2014-03-05 DIAGNOSIS — M179 Osteoarthritis of knee, unspecified: Secondary | ICD-10-CM | POA: Insufficient documentation

## 2014-03-05 DIAGNOSIS — Z01818 Encounter for other preprocedural examination: Secondary | ICD-10-CM | POA: Insufficient documentation

## 2014-03-05 HISTORY — DX: Hypothyroidism, unspecified: E03.9

## 2014-03-05 HISTORY — DX: Reserved for inherently not codable concepts without codable children: IMO0001

## 2014-03-05 HISTORY — DX: Pneumonia, unspecified organism: J18.9

## 2014-03-05 LAB — COMPREHENSIVE METABOLIC PANEL
ALK PHOS: 43 U/L (ref 39–117)
ALT: 30 U/L (ref 0–53)
AST: 32 U/L (ref 0–37)
Albumin: 4 g/dL (ref 3.5–5.2)
Anion gap: 9 (ref 5–15)
BILIRUBIN TOTAL: 0.7 mg/dL (ref 0.3–1.2)
BUN: 31 mg/dL — ABNORMAL HIGH (ref 6–23)
CO2: 25 mmol/L (ref 19–32)
Calcium: 9.7 mg/dL (ref 8.4–10.5)
Chloride: 106 mmol/L (ref 96–112)
Creatinine, Ser: 1.27 mg/dL (ref 0.50–1.35)
GFR calc non Af Amer: 54 mL/min — ABNORMAL LOW (ref >90)
GFR, EST AFRICAN AMERICAN: 63 mL/min — AB (ref >90)
Glucose, Bld: 98 mg/dL (ref 70–99)
Potassium: 4.5 mmol/L (ref 3.5–5.1)
SODIUM: 140 mmol/L (ref 135–145)
Total Protein: 7.9 g/dL (ref 6.0–8.3)

## 2014-03-05 LAB — URINALYSIS, ROUTINE W REFLEX MICROSCOPIC
BILIRUBIN URINE: NEGATIVE
Glucose, UA: NEGATIVE mg/dL
Ketones, ur: NEGATIVE mg/dL
Leukocytes, UA: NEGATIVE
Nitrite: NEGATIVE
Protein, ur: NEGATIVE mg/dL
SPECIFIC GRAVITY, URINE: 1.031 — AB (ref 1.005–1.030)
UROBILINOGEN UA: 1 mg/dL (ref 0.0–1.0)
pH: 5.5 (ref 5.0–8.0)

## 2014-03-05 LAB — CBC
HCT: 47 % (ref 39.0–52.0)
Hemoglobin: 16.2 g/dL (ref 13.0–17.0)
MCH: 32 pg (ref 26.0–34.0)
MCHC: 34.5 g/dL (ref 30.0–36.0)
MCV: 92.9 fL (ref 78.0–100.0)
Platelets: 204 10*3/uL (ref 150–400)
RBC: 5.06 MIL/uL (ref 4.22–5.81)
RDW: 14.5 % (ref 11.5–15.5)
WBC: 11 10*3/uL — ABNORMAL HIGH (ref 4.0–10.5)

## 2014-03-05 LAB — PROTIME-INR
INR: 2.09 — ABNORMAL HIGH (ref 0.00–1.49)
Prothrombin Time: 23.6 seconds — ABNORMAL HIGH (ref 11.6–15.2)

## 2014-03-05 LAB — URINE MICROSCOPIC-ADD ON

## 2014-03-05 LAB — APTT: aPTT: 42 seconds — ABNORMAL HIGH (ref 24–37)

## 2014-03-05 LAB — SURGICAL PCR SCREEN
MRSA, PCR: NEGATIVE
Staphylococcus aureus: NEGATIVE

## 2014-03-05 NOTE — Progress Notes (Signed)
PT,PTT and CMP results done 03/05/14 routed via EPIC to Dr Wynelle Link.

## 2014-03-05 NOTE — Progress Notes (Signed)
EKG- 10/14/13 - EPIC  CXR- 10/14/13 EPIC

## 2014-03-05 NOTE — Progress Notes (Signed)
U/A with micro results done 03/05/14 faxed via EPIC to Dr Wynelle Link.

## 2014-03-09 ENCOUNTER — Ambulatory Visit: Payer: Self-pay | Admitting: Orthopedic Surgery

## 2014-03-09 NOTE — H&P (Signed)
[name] [name] DOB: 15-Jan-1941 Married / Language: English / Race: White Male Date of Admission:  03/10/2014 CC:  Left Knee Pain History of Present Illness The patient is a 74 year old male who comes in for a preoperative History and Physical. The patient is scheduled for a left total knee arthroplasty to be performed by Dr. Dione Plover. Aluisio, MD at [facility] on 03-10-2014. The patient is a 74 year old male presenting several months out from right total knee arthroplasty. The patient states that he is doing well at this time. The pain is under excellent control at this time and describe their pain as mild. They are currently on Ultram for their pain. He reports that he is having increased pain in the left knee and feels like that is holding up the progress on his right knee. He is now scheduled to have the left knee replaced. He was given a cortisone injection recently but only provided only temporary benefit. It is giving him quite a bit of trouble. The right knee is doing very well except for the fact that it is having to bear most of the weight because the left knee is some problematic. He is admitted for left knee repalcement. They have been treated conservatively in the past for the above stated problem and despite conservative measures, they continue to have progressive pain and severe functional limitations and dysfunction. They have failed non-operative management including home exercise, medications, and injections. It is felt that they would benefit from undergoing total joint replacement. Risks and benefits of the procedure have been discussed with the patient and they elect to proceed with surgery. There are no active contraindications to surgery such as ongoing infection or rapidly progressive neurological disease.   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 Blisters Lipitor *ANTIHYPERLIPIDEMICS* Weakness, Arthralgias ACE Inhibitors Cough.  Social History Post-Surgical Plans Home Advance Directives Living Will, Healthcare POA Pain Contract no Illicit drug use no Marital status married Living situation live with spouse Tobacco use never smoker Tobacco / smoke exposure no Alcohol use never consumed alcohol Current work status retired Engineer, agricultural (Currently) no Children 3 Drug/Alcohol Rehab (Previously) no  Medication History Thyrox (50MCG Tablet, Oral) Active. Ultram (50MG  Tablet, Oral) Active. Fish Oil Active. Multivitamin Active. Synthroid (50MCG Tablet, Oral) Active. Zetia (10MG  Tablet, Oral) Active. Coumadin (5MG  Tablet, Oral) Active. Toprol XL (50MG  Tablet ER, Oral) Active. Cozaar (100MG  Tablet, Oral) Active. Viagra (100MG  Tablet, Oral) Active. (prn)  Past Surgical History  Hydrocele Date: 76. CABG Date: 1995. Circumcision Date: 1964. Ankle Surgery Date: 82. left Total Knee Replacement - Right Date: 10/21/2013.   Review of Systems General Not Present- Chills, Fatigue, Fever, Memory Loss, Night Sweats, Weight Gain and Weight Loss. Skin Not Present- Eczema, Hives, Itching, Lesions and Rash. HEENT Present- Hearing Loss and Tinnitus. Not Present- Dentures, Double Vision, Headache and Visual Loss. Respiratory Present- Shortness of breath with exertion. Not Present- Allergies, Chronic Cough, Coughing up blood and  Shortness of breath at rest. Cardiovascular Not Present- Chest Pain, Difficulty Breathing Lying Down, Murmur, Palpitations, Racing/skipping heartbeats and Swelling. Gastrointestinal Not Present- Abdominal  Pain, Bloody Stool, Constipation, Diarrhea, Difficulty Swallowing, Heartburn, Jaundice, Loss of appetitie, Nausea and Vomiting. Male Genitourinary Present- Urinary frequency. Not Present- Blood in Urine, Discharge, Flank Pain, Incontinence, Painful Urination, Urgency, Urinary Retention, Urinating at Night and Weak urinary stream. Musculoskeletal Present- Joint Pain and Joint Swelling. Not Present- Back Pain, Morning Stiffness, Muscle Pain, Muscle Weakness and Spasms. Neurological Not Present- Blackout spells, Difficulty with balance, Dizziness, Paralysis, Tremor and Weakness. Psychiatric Not Present- Insomnia.  Vitals Weight: 245 lb Height: 68in Weight was reported by patient. Height was reported by patient. Body Surface Area: 2.23 m Body Mass Index: 37.25 kg/m  BP: 146/78 (Sitting, Right Arm, Standard)  Physical Exam General Mental Status -Alert, cooperative and good historian. General Appearance-pleasant, Not in acute distress. Orientation-Oriented X3. Build & Nutrition-Well nourished and Well developed.  Head and Neck Head-normocephalic, atraumatic . Neck Global Assessment - supple, no bruit auscultated on the right, no bruit auscultated on the left.  Eye Vision-Wears corrective lenses. Pupil - Bilateral-Regular and Round. Motion - Bilateral-EOMI.  ENMT Note: bilateral hearing aids   Chest and Lung Exam Auscultation Breath sounds - clear at anterior chest wall and clear at posterior chest wall. Adventitious sounds - No Adventitious sounds.  Cardiovascular Auscultation Rhythm - Regular rate and rhythm. Heart Sounds - S1 WNL and S2 WNL. Murmurs & Other Heart Sounds: Murmur 1 - Location - Aortic Area and Pulmonic Area. Timing - Holosystolic. Grade  - II/VI. Character - Low pitched.  Abdomen Inspection Contour - Generalized moderate distention. Palpation/Percussion Tenderness - Abdomen is non-tender to palpation. Rigidity (guarding) - Abdomen is soft. Auscultation Auscultation of the abdomen reveals - Bowel sounds normal.  Male Genitourinary Note: Not done, not pertinent to present illness   Musculoskeletal Note: On exam, he is alert and oriented in no apparent distress. The left knee no effusion. Varus deformity. Range is about 5-125 on both knees. He is tender medial greater than lateral with no instability noted. On exam he is alert and oriented. He is in no acute distress. He is tender along the medial and lateral jointlines. He does have a varus deformity. Significant patellofemoral crepitance. No effusion noted. Distal pulses 2+. Sensation and motor function are intact in the lower extremities.  RADIOGRAPHS: Radiographs are reviewed. AP and lateral of the left knee shows he has advanced arthritic change in the medial and patellofemoral compartments. (The right knee has since been replaced.)   Assessment & Plan Primary localized osteoarthritis of left knee (M17.12)  Lovenox 40MG /0.4ML, 1 (one) Solution daily, 4 Syringe, 02/27/2014, No Refill. Local Order: Sub-Q injections daily for four days prior to surgery. Take each morning on Feb. 18th through Feb. 21st. Note:Surgical Plans: Left Total Knee Repalcement  Disposition: Home  PCP: Dr. Quillian Quince  Topical TXA: CAD, DVT, PE  Please note that the patient was instructed to stop his Coumadin four days prior to surgery on February 22nd. The last dose of Coumadin with be on Wednesday March 17th. He will use Lovenox Bridge injections for four days prior to surgery (March 18th - March 21st). He will be placed back onto Coumadin postop along with Lovenox bridging until his Coumadin is back into a therapeutic range.  Anesthesia Issues: None  Signed electronically by  Joelene Millin, III PA-C

## 2014-03-10 ENCOUNTER — Inpatient Hospital Stay (HOSPITAL_COMMUNITY): Payer: Medicare Other | Admitting: Anesthesiology

## 2014-03-10 ENCOUNTER — Encounter (HOSPITAL_COMMUNITY): Payer: Self-pay | Admitting: *Deleted

## 2014-03-10 ENCOUNTER — Encounter (HOSPITAL_COMMUNITY)
Admission: RE | Disposition: A | Discharge status: Home Health | Payer: Self-pay | Source: Ambulatory Visit | Attending: Orthopedic Surgery

## 2014-03-10 ENCOUNTER — Inpatient Hospital Stay (HOSPITAL_COMMUNITY)
Admission: RE | Admit: 2014-03-10 | Discharge: 2014-03-12 | DRG: 470 | Disposition: A | Discharge status: Home Health | Payer: Medicare Other | Source: Ambulatory Visit | Attending: Orthopedic Surgery | Admitting: Orthopedic Surgery

## 2014-03-10 DIAGNOSIS — Z01812 Encounter for preprocedural laboratory examination: Secondary | ICD-10-CM | POA: Diagnosis not present

## 2014-03-10 DIAGNOSIS — Z951 Presence of aortocoronary bypass graft: Secondary | ICD-10-CM | POA: Diagnosis not present

## 2014-03-10 DIAGNOSIS — E780 Pure hypercholesterolemia: Secondary | ICD-10-CM | POA: Diagnosis present

## 2014-03-10 DIAGNOSIS — Z86711 Personal history of pulmonary embolism: Secondary | ICD-10-CM | POA: Diagnosis not present

## 2014-03-10 DIAGNOSIS — D6859 Other primary thrombophilia: Secondary | ICD-10-CM | POA: Diagnosis present

## 2014-03-10 DIAGNOSIS — I2510 Atherosclerotic heart disease of native coronary artery without angina pectoris: Secondary | ICD-10-CM | POA: Diagnosis present

## 2014-03-10 DIAGNOSIS — I10 Essential (primary) hypertension: Secondary | ICD-10-CM | POA: Diagnosis present

## 2014-03-10 DIAGNOSIS — M179 Osteoarthritis of knee, unspecified: Secondary | ICD-10-CM | POA: Diagnosis present

## 2014-03-10 DIAGNOSIS — Z7901 Long term (current) use of anticoagulants: Secondary | ICD-10-CM | POA: Diagnosis not present

## 2014-03-10 DIAGNOSIS — M1712 Unilateral primary osteoarthritis, left knee: Secondary | ICD-10-CM | POA: Diagnosis not present

## 2014-03-10 DIAGNOSIS — I739 Peripheral vascular disease, unspecified: Secondary | ICD-10-CM | POA: Diagnosis not present

## 2014-03-10 DIAGNOSIS — E039 Hypothyroidism, unspecified: Secondary | ICD-10-CM | POA: Diagnosis present

## 2014-03-10 DIAGNOSIS — M25562 Pain in left knee: Secondary | ICD-10-CM | POA: Diagnosis not present

## 2014-03-10 DIAGNOSIS — M1710 Unilateral primary osteoarthritis, unspecified knee: Secondary | ICD-10-CM | POA: Diagnosis present

## 2014-03-10 HISTORY — PX: TOTAL KNEE ARTHROPLASTY: SHX125

## 2014-03-10 LAB — PROTIME-INR
INR: 1.14 (ref 0.00–1.49)
PROTHROMBIN TIME: 14.8 s (ref 11.6–15.2)

## 2014-03-10 LAB — TYPE AND SCREEN
ABO/RH(D): A POS
Antibody Screen: NEGATIVE

## 2014-03-10 SURGERY — ARTHROPLASTY, KNEE, TOTAL
Anesthesia: Monitor Anesthesia Care | Site: Knee | Laterality: Left

## 2014-03-10 MED ORDER — EPHEDRINE SULFATE 50 MG/ML IJ SOLN
INTRAMUSCULAR | Status: AC
  Filled 2014-03-10: qty 1

## 2014-03-10 MED ORDER — EPHEDRINE SULFATE 50 MG/ML IJ SOLN
INTRAMUSCULAR | Status: DC | PRN
  Administered 2014-03-10: 10 mg via INTRAVENOUS
  Administered 2014-03-10: 5 mg via INTRAVENOUS
  Administered 2014-03-10: 10 mg via INTRAVENOUS
  Administered 2014-03-10 (×2): 5 mg via INTRAVENOUS

## 2014-03-10 MED ORDER — PROPOFOL 10 MG/ML IV BOLUS
INTRAVENOUS | Status: AC
  Filled 2014-03-10: qty 20

## 2014-03-10 MED ORDER — LIDOCAINE HCL (CARDIAC) 20 MG/ML IV SOLN
INTRAVENOUS | Status: AC
  Filled 2014-03-10: qty 5

## 2014-03-10 MED ORDER — DOCUSATE SODIUM 100 MG PO CAPS
100.0000 mg | ORAL_CAPSULE | Freq: Two times a day (BID) | ORAL | Status: DC
  Administered 2014-03-10 – 2014-03-12 (×4): 100 mg via ORAL

## 2014-03-10 MED ORDER — PROPOFOL 10 MG/ML IV BOLUS
INTRAVENOUS | Status: AC
Start: 2014-03-10 — End: 2014-03-10
  Filled 2014-03-10: qty 20

## 2014-03-10 MED ORDER — METOCLOPRAMIDE HCL 5 MG/ML IJ SOLN: 5.0000 mg | Freq: Three times a day (TID) | INTRAMUSCULAR | Status: DC | PRN

## 2014-03-10 MED ORDER — LOSARTAN POTASSIUM 50 MG PO TABS
100.0000 mg | ORAL_TABLET | Freq: Every morning | ORAL | Status: DC
  Administered 2014-03-10 – 2014-03-12 (×3): 100 mg via ORAL
  Filled 2014-03-10 (×3): qty 2

## 2014-03-10 MED ORDER — CEFAZOLIN SODIUM-DEXTROSE 2-3 GM-% IV SOLR
INTRAVENOUS | Status: AC
  Filled 2014-03-10: qty 50

## 2014-03-10 MED ORDER — DIPHENHYDRAMINE HCL 12.5 MG/5ML PO ELIX: 12.5000 mg | ORAL_SOLUTION | ORAL | Status: DC | PRN

## 2014-03-10 MED ORDER — PROPOFOL INFUSION 10 MG/ML OPTIME
INTRAVENOUS | Status: DC | PRN
  Administered 2014-03-10: 50 ug/kg/min via INTRAVENOUS

## 2014-03-10 MED ORDER — MENTHOL 3 MG MT LOZG
1.0000 | LOZENGE | OROMUCOSAL | Status: DC | PRN
  Filled 2014-03-10: qty 9

## 2014-03-10 MED ORDER — MIDAZOLAM HCL 5 MG/5ML IJ SOLN
INTRAMUSCULAR | Status: DC | PRN
  Administered 2014-03-10: 2 mg via INTRAVENOUS

## 2014-03-10 MED ORDER — BISACODYL 10 MG RE SUPP: 10.0000 mg | Freq: Every day | RECTAL | Status: DC | PRN

## 2014-03-10 MED ORDER — ACETAMINOPHEN 500 MG PO TABS
1000.0000 mg | ORAL_TABLET | Freq: Four times a day (QID) | ORAL | Status: AC
Start: 2014-03-10 — End: 2014-03-11
  Administered 2014-03-10 – 2014-03-11 (×4): 1000 mg via ORAL
  Filled 2014-03-10 (×4): qty 2

## 2014-03-10 MED ORDER — TRAMADOL HCL 50 MG PO TABS: 50.0000 mg | ORAL_TABLET | Freq: Four times a day (QID) | ORAL | Status: DC | PRN

## 2014-03-10 MED ORDER — METOPROLOL SUCCINATE ER 50 MG PO TB24: 50.0000 mg | ORAL_TABLET | Freq: Every day | ORAL | Status: DC

## 2014-03-10 MED ORDER — BUPIVACAINE HCL 0.25 % IJ SOLN
INTRAMUSCULAR | Status: DC | PRN
  Administered 2014-03-10: 30 mL

## 2014-03-10 MED ORDER — ONDANSETRON HCL 4 MG PO TABS: 4.0000 mg | ORAL_TABLET | Freq: Four times a day (QID) | ORAL | Status: DC | PRN

## 2014-03-10 MED ORDER — ONDANSETRON HCL 4 MG/2ML IJ SOLN
INTRAMUSCULAR | Status: AC
  Filled 2014-03-10: qty 2

## 2014-03-10 MED ORDER — CEFAZOLIN SODIUM-DEXTROSE 2-3 GM-% IV SOLR
2.0000 g | INTRAVENOUS | Status: AC
  Administered 2014-03-10: 2 g via INTRAVENOUS

## 2014-03-10 MED ORDER — ACETAMINOPHEN 325 MG PO TABS: 650.0000 mg | ORAL_TABLET | Freq: Four times a day (QID) | ORAL | Status: DC | PRN

## 2014-03-10 MED ORDER — DEXAMETHASONE SODIUM PHOSPHATE 10 MG/ML IJ SOLN
10.0000 mg | Freq: Once | INTRAMUSCULAR | Status: AC
  Administered 2014-03-11: 10 mg via INTRAVENOUS
  Filled 2014-03-10: qty 1

## 2014-03-10 MED ORDER — LEVOTHYROXINE SODIUM 50 MCG PO TABS
50.0000 ug | ORAL_TABLET | Freq: Every day | ORAL | Status: DC
  Administered 2014-03-11 – 2014-03-12 (×2): 50 ug via ORAL
  Filled 2014-03-10 (×3): qty 1

## 2014-03-10 MED ORDER — ENOXAPARIN SODIUM 30 MG/0.3ML ~~LOC~~ SOLN
30.0000 mg | Freq: Two times a day (BID) | SUBCUTANEOUS | Status: DC
  Administered 2014-03-11 – 2014-03-12 (×3): 30 mg via SUBCUTANEOUS
  Filled 2014-03-10 (×5): qty 0.3

## 2014-03-10 MED ORDER — METHOCARBAMOL 500 MG PO TABS
500.0000 mg | ORAL_TABLET | Freq: Four times a day (QID) | ORAL | Status: DC | PRN
  Administered 2014-03-10 – 2014-03-11 (×3): 500 mg via ORAL
  Filled 2014-03-10 (×4): qty 1

## 2014-03-10 MED ORDER — CEFAZOLIN SODIUM-DEXTROSE 2-3 GM-% IV SOLR
2.0000 g | Freq: Four times a day (QID) | INTRAVENOUS | Status: AC
  Administered 2014-03-10 (×2): 2 g via INTRAVENOUS
  Filled 2014-03-10 (×2): qty 50

## 2014-03-10 MED ORDER — DEXAMETHASONE SODIUM PHOSPHATE 10 MG/ML IJ SOLN
10.0000 mg | Freq: Once | INTRAMUSCULAR | Status: AC
  Administered 2014-03-10: 10 mg via INTRAVENOUS

## 2014-03-10 MED ORDER — POLYETHYLENE GLYCOL 3350 17 G PO PACK: 17.0000 g | PACK | Freq: Every day | ORAL | Status: DC | PRN

## 2014-03-10 MED ORDER — BUPIVACAINE HCL (PF) 0.25 % IJ SOLN
INTRAMUSCULAR | Status: AC
  Filled 2014-03-10: qty 30

## 2014-03-10 MED ORDER — LACTATED RINGERS IV SOLN: INTRAVENOUS | Status: DC

## 2014-03-10 MED ORDER — SODIUM CHLORIDE 0.9 % IJ SOLN
INTRAMUSCULAR | Status: AC
  Filled 2014-03-10: qty 50

## 2014-03-10 MED ORDER — LACTATED RINGERS IV SOLN
INTRAVENOUS | Status: DC | PRN
  Administered 2014-03-10 (×3): via INTRAVENOUS

## 2014-03-10 MED ORDER — BUPIVACAINE IN DEXTROSE 0.75-8.25 % IT SOLN
INTRATHECAL | Status: DC | PRN
  Administered 2014-03-10: 1.5 mL via INTRATHECAL

## 2014-03-10 MED ORDER — MIDAZOLAM HCL 2 MG/2ML IJ SOLN
INTRAMUSCULAR | Status: AC
  Filled 2014-03-10: qty 2

## 2014-03-10 MED ORDER — TRANEXAMIC ACID 100 MG/ML IV SOLN
2000.0000 mg | INTRAVENOUS | Status: DC | PRN
  Administered 2014-03-10: 2000 mg via TOPICAL

## 2014-03-10 MED ORDER — METHOCARBAMOL 1000 MG/10ML IJ SOLN
500.0000 mg | Freq: Four times a day (QID) | INTRAVENOUS | Status: DC | PRN
  Administered 2014-03-10: 500 mg via INTRAVENOUS
  Filled 2014-03-10 (×2): qty 5

## 2014-03-10 MED ORDER — SODIUM CHLORIDE 0.9 % IJ SOLN
INTRAMUSCULAR | Status: DC | PRN
  Administered 2014-03-10: 30 mL via INTRAVENOUS

## 2014-03-10 MED ORDER — FENTANYL CITRATE 0.05 MG/ML IJ SOLN
INTRAMUSCULAR | Status: AC
  Filled 2014-03-10: qty 2

## 2014-03-10 MED ORDER — BUPIVACAINE LIPOSOME 1.3 % IJ SUSP
20.0000 mL | Freq: Once | INTRAMUSCULAR | Status: DC
  Filled 2014-03-10: qty 20

## 2014-03-10 MED ORDER — WARFARIN SODIUM 7.5 MG PO TABS
7.5000 mg | ORAL_TABLET | Freq: Once | ORAL | Status: AC
  Administered 2014-03-10: 7.5 mg via ORAL
  Filled 2014-03-10: qty 1

## 2014-03-10 MED ORDER — METOCLOPRAMIDE HCL 10 MG PO TABS: 5.0000 mg | ORAL_TABLET | Freq: Three times a day (TID) | ORAL | Status: DC | PRN

## 2014-03-10 MED ORDER — 0.9 % SODIUM CHLORIDE (POUR BTL) OPTIME
TOPICAL | Status: DC | PRN
  Administered 2014-03-10: 1000 mL

## 2014-03-10 MED ORDER — WARFARIN - PHARMACIST DOSING INPATIENT: Freq: Every day | Status: DC

## 2014-03-10 MED ORDER — LIDOCAINE HCL (CARDIAC) 20 MG/ML IV SOLN
INTRAVENOUS | Status: DC | PRN
  Administered 2014-03-10: 50 mg via INTRAVENOUS

## 2014-03-10 MED ORDER — DEXAMETHASONE SODIUM PHOSPHATE 10 MG/ML IJ SOLN
INTRAMUSCULAR | Status: AC
  Filled 2014-03-10: qty 1

## 2014-03-10 MED ORDER — ONDANSETRON HCL 4 MG/2ML IJ SOLN: 4.0000 mg | Freq: Four times a day (QID) | INTRAMUSCULAR | Status: DC | PRN

## 2014-03-10 MED ORDER — TRANEXAMIC ACID 100 MG/ML IV SOLN
2000.0000 mg | Freq: Once | INTRAVENOUS | Status: DC
  Filled 2014-03-10 (×2): qty 20

## 2014-03-10 MED ORDER — BUPIVACAINE LIPOSOME 1.3 % IJ SUSP
INTRAMUSCULAR | Status: DC | PRN
  Administered 2014-03-10: 20 mL

## 2014-03-10 MED ORDER — ONDANSETRON HCL 4 MG/2ML IJ SOLN
INTRAMUSCULAR | Status: DC | PRN
  Administered 2014-03-10: 4 mg via INTRAVENOUS

## 2014-03-10 MED ORDER — METOPROLOL SUCCINATE ER 50 MG PO TB24
50.0000 mg | ORAL_TABLET | Freq: Every day | ORAL | Status: DC
  Administered 2014-03-10 – 2014-03-12 (×3): 50 mg via ORAL
  Filled 2014-03-10 (×3): qty 1

## 2014-03-10 MED ORDER — SODIUM CHLORIDE 0.9 % IJ SOLN
INTRAMUSCULAR | Status: AC
  Filled 2014-03-10: qty 10

## 2014-03-10 MED ORDER — FLEET ENEMA 7-19 GM/118ML RE ENEM: 1.0000 | ENEMA | Freq: Once | RECTAL | Status: AC | PRN

## 2014-03-10 MED ORDER — FUROSEMIDE 10 MG/ML IJ SOLN
20.0000 mg | Freq: Once | INTRAMUSCULAR | Status: AC
  Administered 2014-03-10: 20 mg via INTRAVENOUS
  Filled 2014-03-10: qty 2

## 2014-03-10 MED ORDER — ACETAMINOPHEN 10 MG/ML IV SOLN
1000.0000 mg | Freq: Once | INTRAVENOUS | Status: AC
  Administered 2014-03-10: 1000 mg via INTRAVENOUS
  Filled 2014-03-10: qty 100

## 2014-03-10 MED ORDER — FENTANYL CITRATE 0.05 MG/ML IJ SOLN
INTRAMUSCULAR | Status: DC | PRN
  Administered 2014-03-10: 100 ug via INTRAVENOUS

## 2014-03-10 MED ORDER — MORPHINE SULFATE 2 MG/ML IJ SOLN: 1.0000 mg | INTRAMUSCULAR | Status: DC | PRN

## 2014-03-10 MED ORDER — EZETIMIBE 10 MG PO TABS
10.0000 mg | ORAL_TABLET | Freq: Every morning | ORAL | Status: DC
  Administered 2014-03-10 – 2014-03-12 (×3): 10 mg via ORAL
  Filled 2014-03-10 (×3): qty 1

## 2014-03-10 MED ORDER — DOCUSATE SODIUM 100 MG PO CAPS: 100.0000 mg | ORAL_CAPSULE | Freq: Two times a day (BID) | ORAL | Status: DC

## 2014-03-10 MED ORDER — HYDROMORPHONE HCL 1 MG/ML IJ SOLN: 0.2500 mg | INTRAMUSCULAR | Status: DC | PRN

## 2014-03-10 MED ORDER — OXYCODONE HCL 5 MG PO TABS
5.0000 mg | ORAL_TABLET | ORAL | Status: DC | PRN
  Administered 2014-03-10: 10 mg via ORAL
  Administered 2014-03-10: 5 mg via ORAL
  Administered 2014-03-10 – 2014-03-12 (×8): 10 mg via ORAL
  Filled 2014-03-10 (×6): qty 2
  Filled 2014-03-10: qty 1
  Filled 2014-03-10 (×3): qty 2

## 2014-03-10 MED ORDER — ACETAMINOPHEN 650 MG RE SUPP: 650.0000 mg | Freq: Four times a day (QID) | RECTAL | Status: DC | PRN

## 2014-03-10 MED ORDER — DEXTROSE-NACL 5-0.9 % IV SOLN
INTRAVENOUS | Status: DC
  Administered 2014-03-10: 100 mL/h via INTRAVENOUS
  Administered 2014-03-10: 19:00:00 via INTRAVENOUS

## 2014-03-10 MED ORDER — PHENOL 1.4 % MT LIQD: 1.0000 | OROMUCOSAL | Status: DC | PRN

## 2014-03-10 MED ORDER — CHLORHEXIDINE GLUCONATE 4 % EX LIQD: 60.0000 mL | Freq: Once | CUTANEOUS | Status: DC

## 2014-03-10 SURGICAL SUPPLY — 61 items
BAG ZIPLOCK 12X15 (MISCELLANEOUS) ×3 IMPLANT
BANDAGE ELASTIC 6 VELCRO ST LF (GAUZE/BANDAGES/DRESSINGS) ×3 IMPLANT
BANDAGE ESMARK 6X9 LF (GAUZE/BANDAGES/DRESSINGS) ×1 IMPLANT
BLADE SAG 18X100X1.27 (BLADE) ×3 IMPLANT
BLADE SAW SGTL 11.0X1.19X90.0M (BLADE) ×3 IMPLANT
BNDG ESMARK 6X9 LF (GAUZE/BANDAGES/DRESSINGS) ×3
BOWL SMART MIX CTS (DISPOSABLE) ×3 IMPLANT
CAP KNEE TOTAL 3 SIGMA ×3 IMPLANT
CEMENT HV SMART SET (Cement) ×6 IMPLANT
CLOSURE WOUND 1/2 X4 (GAUZE/BANDAGES/DRESSINGS) ×1
CUFF TOURN SGL QUICK 34 (TOURNIQUET CUFF) ×2
CUFF TRNQT CYL 34X4X40X1 (TOURNIQUET CUFF) ×1 IMPLANT
DECANTER SPIKE VIAL GLASS SM (MISCELLANEOUS) ×3 IMPLANT
DRAPE EXTREMITY T 121X128X90 (DRAPE) ×3 IMPLANT
DRAPE POUCH INSTRU U-SHP 10X18 (DRAPES) ×3 IMPLANT
DRAPE U-SHAPE 47X51 STRL (DRAPES) ×3 IMPLANT
DRSG ADAPTIC 3X8 NADH LF (GAUZE/BANDAGES/DRESSINGS) ×3 IMPLANT
DRSG PAD ABDOMINAL 8X10 ST (GAUZE/BANDAGES/DRESSINGS) ×3 IMPLANT
DURAPREP 26ML APPLICATOR (WOUND CARE) ×3 IMPLANT
ELECT REM PT RETURN 9FT ADLT (ELECTROSURGICAL) ×3
ELECTRODE REM PT RTRN 9FT ADLT (ELECTROSURGICAL) ×1 IMPLANT
EVACUATOR 1/8 PVC DRAIN (DRAIN) ×3 IMPLANT
FACESHIELD WRAPAROUND (MASK) ×15 IMPLANT
GAUZE SPONGE 4X4 12PLY STRL (GAUZE/BANDAGES/DRESSINGS) ×3 IMPLANT
GLOVE BIO SURGEON STRL SZ7.5 (GLOVE) IMPLANT
GLOVE BIO SURGEON STRL SZ8 (GLOVE) ×3 IMPLANT
GLOVE BIOGEL PI IND STRL 6.5 (GLOVE) IMPLANT
GLOVE BIOGEL PI IND STRL 8 (GLOVE) ×1 IMPLANT
GLOVE BIOGEL PI INDICATOR 6.5 (GLOVE)
GLOVE BIOGEL PI INDICATOR 8 (GLOVE) ×2
GLOVE SURG SS PI 6.5 STRL IVOR (GLOVE) IMPLANT
GOWN STRL REUS W/TWL LRG LVL3 (GOWN DISPOSABLE) ×3 IMPLANT
GOWN STRL REUS W/TWL XL LVL3 (GOWN DISPOSABLE) IMPLANT
HANDPIECE INTERPULSE COAX TIP (DISPOSABLE) ×2
IMMOBILIZER KNEE 20 (SOFTGOODS) ×6 IMPLANT
IMMOBILIZER KNEE 20 THIGH 36 (SOFTGOODS) ×1 IMPLANT
KIT BASIN OR (CUSTOM PROCEDURE TRAY) ×3 IMPLANT
MANIFOLD NEPTUNE II (INSTRUMENTS) ×3 IMPLANT
NDL SAFETY ECLIPSE 18X1.5 (NEEDLE) ×2 IMPLANT
NEEDLE HYPO 18GX1.5 SHARP (NEEDLE) ×4
NS IRRIG 1000ML POUR BTL (IV SOLUTION) ×3 IMPLANT
PACK TOTAL JOINT (CUSTOM PROCEDURE TRAY) ×3 IMPLANT
PAD ABD 8X10 STRL (GAUZE/BANDAGES/DRESSINGS) ×3 IMPLANT
PADDING CAST COTTON 6X4 STRL (CAST SUPPLIES) ×3 IMPLANT
PEN SKIN MARKING BROAD (MISCELLANEOUS) ×3 IMPLANT
POSITIONER SURGICAL ARM (MISCELLANEOUS) ×3 IMPLANT
SET HNDPC FAN SPRY TIP SCT (DISPOSABLE) ×1 IMPLANT
STRIP CLOSURE SKIN 1/2X4 (GAUZE/BANDAGES/DRESSINGS) ×2 IMPLANT
SUCTION FRAZIER 12FR DISP (SUCTIONS) ×3 IMPLANT
SUT MNCRL AB 4-0 PS2 18 (SUTURE) ×3 IMPLANT
SUT VIC AB 2-0 CT1 27 (SUTURE) ×6
SUT VIC AB 2-0 CT1 TAPERPNT 27 (SUTURE) ×3 IMPLANT
SUT VLOC 180 0 24IN GS25 (SUTURE) ×3 IMPLANT
SYR 20CC LL (SYRINGE) ×3 IMPLANT
SYR 50ML LL SCALE MARK (SYRINGE) ×3 IMPLANT
TOWEL OR 17X26 10 PK STRL BLUE (TOWEL DISPOSABLE) ×3 IMPLANT
TOWEL OR NON WOVEN STRL DISP B (DISPOSABLE) IMPLANT
TRAY FOLEY CATH 16FRSI W/METER (SET/KITS/TRAYS/PACK) ×3 IMPLANT
WATER STERILE IRR 1500ML POUR (IV SOLUTION) ×3 IMPLANT
WRAP KNEE MAXI GEL POST OP (GAUZE/BANDAGES/DRESSINGS) ×3 IMPLANT
YANKAUER SUCT BULB TIP 10FT TU (MISCELLANEOUS) ×3 IMPLANT

## 2014-03-10 NOTE — Anesthesia Procedure Notes (Signed)
Spinal Patient location during procedure: OR End time: 03/10/2014 7:07 AM Staffing Resident/CRNA: Noralyn Pick Performed by: anesthesiologist and resident/CRNA  Preanesthetic Checklist Completed: patient identified, site marked, surgical consent, pre-op evaluation, timeout performed, IV checked, risks and benefits discussed and monitors and equipment checked Spinal Block Patient position: sitting Prep: Betadine Patient monitoring: heart rate, continuous pulse ox and blood pressure Location: L3-4 Injection technique: single-shot Needle Needle type: Sprotte and Pencil-Tip  Needle gauge: 24 G Needle length: 9 cm Assessment Sensory level: T6 Additional Notes Expiration date of kit checked and confirmed. Patient tolerated procedure well, without complications.

## 2014-03-10 NOTE — Evaluation (Signed)
Physical Therapy Evaluation Patient Details Name: [name] [name] MRN: [id number] DOB: 1940/09/21 Today's Date: 03/10/2014   History of Present Illness  s/p L TKA  Clinical Impression  Pt admitted with above diagnosis. Pt currently with functional limitations due to the deficits listed below (see PT Problem List).  Pt will benefit from skilled PT to increase their independence and safety with mobility to allow discharge to the venue listed below.  Pt should progress well, plan is for HHPT and wife support, has DME     Follow Up Recommendations Home health PT    Equipment Recommendations  None recommended by PT    Recommendations for Other Services       Precautions / Restrictions Precautions Precautions: Knee Required Braces or Orthoses: Knee Immobilizer - Left Knee Immobilizer - Left: Discontinue once straight leg raise with < 10 degree lag Restrictions Other Position/Activity Restrictions: WBAT      Mobility  Bed Mobility Overal bed mobility: Needs Assistance Bed Mobility: Supine to Sit     Supine to sit: Min guard     General bed mobility comments: cues for technique  Transfers Overall transfer level: Needs assistance Equipment used: Rolling walker (2 wheeled) Transfers: Sit to/from Stand Sit to Stand: Min guard         General transfer comment: cues for sequence  Ambulation/Gait Ambulation/Gait assistance: Min guard Ambulation Distance (Feet): 90 Feet Assistive device: Rolling walker (2 wheeled) Gait Pattern/deviations: Step-to pattern     General Gait Details: cues for sequence, RW safety  Stairs            Wheelchair Mobility    Modified Rankin (Stroke Patients Only)       Balance                                             Pertinent Vitals/Pain Pain Assessment: 0-10 Pain Score: 2  Pain Location: L knee Pain Descriptors / Indicators: Other (Comment) Pain Intervention(s): Limited activity within patient's  tolerance;Monitored during session    [place] expects to be discharged to:: Private residence Living Arrangements: Spouse/significant other   Type of Home: House Home Access: Stairs to enter   [employer] of Steps: 3 Home Layout: One level;Able to live on main level with bedroom/bathroom Home Equipment: Gilford Rile - 2 wheels;Bedside commode;Wheelchair - manual      Prior Function Level of Independence: Independent               Hand Dominance        Extremity/Trunk Assessment   Upper Extremity Assessment: Overall WFL for tasks assessed           Lower Extremity Assessment: LLE deficits/detail   LLE Deficits / Details: hip flexion and knee extension 3/5; ankle WFL     Communication   Communication: No difficulties  Cognition Arousal/Alertness: Awake/alert Behavior During Therapy: WFL for tasks assessed/performed Overall Cognitive Status: Within Functional Limits for tasks assessed                      General Comments      Exercises Total Joint Exercises Ankle Circles/Pumps: AROM;Both;10 reps Quad Sets: 10 reps;AROM;Both      Assessment/Plan    PT Assessment    PT Diagnosis     PT Problem List    PT Treatment Interventions     PT Goals (Current goals  can be found in the Care Plan section) Acute Rehab PT Goals Patient Stated Goal: home soon PT Goal Formulation: With patient Time For Goal Achievement: 03/14/14 Potential to Achieve Goals: Good    Frequency     Barriers to discharge        Co-evaluation               End of Session Equipment Utilized During Treatment: Gait belt Activity Tolerance: Patient tolerated treatment well Patient left: in chair;with call bell/phone within reach;with family/visitor present Nurse Communication: Mobility status         Time: 1416-1440 PT Time Calculation (min) (ACUTE ONLY): 24 min   Charges:   PT Evaluation $Initial PT Evaluation Tier I: 1  Procedure PT Treatments $Gait Training: 8-22 mins   PT G Codes:        Danyiel Crespin 03/15/2014, 2:45 PM

## 2014-03-10 NOTE — Progress Notes (Signed)
Utilization review completed.  

## 2014-03-10 NOTE — Progress Notes (Signed)
ANTICOAGULATION CONSULT NOTE - Initial Consult  Pharmacy Consult for warfarin Indication: History of DVT/PE (protein S def) and VTE prophylaxis  Allergies  Allergen Reactions  . Ace Inhibitors     Cramps, Weak muscles.   . Lipitor [Atorvastatin] Other (See Comments)    Muscle weakness  . Sulfa Antibiotics Hives    Patient Measurements: Height: 5\' 9"  (175.3 cm) Weight: 247 lb (112.038 kg) IBW/kg (Calculated) : 70.7 Heparin Dosing Weight:   Vital Signs: Temp: 98 F (36.7 C) (02/22 0900) Temp Source: Oral (02/22 0457) BP: 155/73 mmHg (02/22 0900) Pulse Rate: 49 (02/22 0900)  Labs:  Recent Labs  03/10/14 0528  LABPROT 14.8  INR 1.14    Estimated Creatinine Clearance: 63.9 mL/min (by C-G formula based on Cr of 1.27).   Medical History: Past Medical History  Diagnosis Date  . Hypertension   . Peripheral vascular disease     hx phelbitis  . Arthritis   . Blood dyscrasia     protein S deficiency  . Tibia fracture     no surgery- got phlebitis at leg under cast   . DVT (deep venous thrombosis)     lungs, legs, arms at various times- Protein S deficiency  . Shortness of breath dyspnea   . Pneumonia     hx of pneumonia x 2   . Hypothyroidism    Assessment: 61 YOM s/p L TKA 2/22.  He has history of protein S deficiency with prior DVT/PE.  Pharmacy asked to continue warfarin therapy.  He is also prescribed enoxaparin 30mg  SQ q12h to begin POD#1.  He had [name] performed in October 2015   Home dose:  7.5mg  daily  Today, 03/10/2014:   INR = 1.14  CBC: 2/18 - Hgb adn Pltc WNL    Goal of Therapy:  INR 2-3 Monitor platelets by anticoagulation protocol: Yes   Plan:   Give warfarin 7.5mg  PO x 1 dose tonight (INR trended up nicely with this dosing in Oct but appears maintenance dose has increased since then so may need higher dose)  Daily INR  Continue enoxaparin until INR > 2  Monitor for bleeding  Doreene Eland, PharmD, BCPS.   Pager: [phone]  03/10/2014,9:41 AM

## 2014-03-10 NOTE — H&P (View-Only) (Signed)
[name] [name] DOB: [birth year] Married / Language: English / Race: White Male Date of Admission:  03/10/2014 CC:  Left Knee Pain History of Present Illness The patient is a 74 year old male who comes in for a preoperative History and Physical. The patient is scheduled for a left total knee arthroplasty to be performed by Dr. Dione Plover. Aluisio, MD at [facility] on 03-10-2014. The patient is a 74 year old male presenting several months out from right total knee arthroplasty. The patient states that he is doing well at this time. The pain is under excellent control at this time and describe their pain as mild. They are currently on Ultram for their pain. He reports that he is having increased pain in the left knee and feels like that is holding up the progress on his right knee. He is now scheduled to have the left knee replaced. He was given a cortisone injection recently but only provided only temporary benefit. It is giving him quite a bit of trouble. The right knee is doing very well except for the fact that it is having to bear most of the weight because the left knee is some problematic. He is admitted for left knee repalcement. They have been treated conservatively in the past for the above stated problem and despite conservative measures, they continue to have progressive pain and severe functional limitations and dysfunction. They have failed non-operative management including home exercise, medications, and injections. It is felt that they would benefit from undergoing total joint replacement. Risks and benefits of the procedure have been discussed with the patient and they elect to proceed with surgery. There are no active contraindications to surgery such as ongoing infection or rapidly progressive neurological disease.   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 Blisters Lipitor *ANTIHYPERLIPIDEMICS* Weakness, Arthralgias ACE Inhibitors Cough.  Social History Post-Surgical Plans Home Advance Directives Living Will, Healthcare POA Pain Contract no Illicit drug use no Marital status married Living situation live with spouse Tobacco use never smoker Tobacco / smoke exposure no Alcohol use never consumed alcohol Current work status retired Engineer, agricultural (Currently) no Children 3 Drug/Alcohol Rehab (Previously) no  Medication History Thyrox (50MCG Tablet, Oral) Active. Ultram (50MG  Tablet, Oral) Active. Fish Oil Active. Multivitamin Active. Synthroid (50MCG Tablet, Oral) Active. Zetia (10MG  Tablet, Oral) Active. Coumadin (5MG  Tablet, Oral) Active. Toprol XL (50MG  Tablet ER, Oral) Active. Cozaar (100MG  Tablet, Oral) Active. Viagra (100MG  Tablet, Oral) Active. (prn)  Past Surgical History  Hydrocele Date: 24. CABG Date: 1995. Circumcision Date: 1964. Ankle Surgery Date: 60. left Total Knee Replacement - Right Date: 10/21/2013.   Review of Systems General Not Present- Chills, Fatigue, Fever, Memory Loss, Night Sweats, Weight Gain and Weight Loss. Skin Not Present- Eczema, Hives, Itching, Lesions and Rash. HEENT Present- Hearing Loss and Tinnitus. Not Present- Dentures, Double Vision, Headache and Visual Loss. Respiratory Present- Shortness of breath with exertion. Not Present- Allergies, Chronic Cough, Coughing up blood and  Shortness of breath at rest. Cardiovascular Not Present- Chest Pain, Difficulty Breathing Lying Down, Murmur, Palpitations, Racing/skipping heartbeats and Swelling. Gastrointestinal Not Present- Abdominal  Pain, Bloody Stool, Constipation, Diarrhea, Difficulty Swallowing, Heartburn, Jaundice, Loss of appetitie, Nausea and Vomiting. Male Genitourinary Present- Urinary frequency. Not Present- Blood in Urine, Discharge, Flank Pain, Incontinence, Painful Urination, Urgency, Urinary Retention, Urinating at Night and Weak urinary stream. Musculoskeletal Present- Joint Pain and Joint Swelling. Not Present- Back Pain, Morning Stiffness, Muscle Pain, Muscle Weakness and Spasms. Neurological Not Present- Blackout spells, Difficulty with balance, Dizziness, Paralysis, Tremor and Weakness. Psychiatric Not Present- Insomnia.  Vitals Weight: 245 lb Height: 68in Weight was reported by patient. Height was reported by patient. Body Surface Area: 2.23 m Body Mass Index: 37.25 kg/m  BP: 146/78 (Sitting, Right Arm, Standard)  Physical Exam General Mental Status -Alert, cooperative and good historian. General Appearance-pleasant, Not in acute distress. Orientation-Oriented X3. Build & Nutrition-Well nourished and Well developed.  Head and Neck Head-normocephalic, atraumatic . Neck Global Assessment - supple, no bruit auscultated on the right, no bruit auscultated on the left.  Eye Vision-Wears corrective lenses. Pupil - Bilateral-Regular and Round. Motion - Bilateral-EOMI.  ENMT Note: bilateral hearing aids   Chest and Lung Exam Auscultation Breath sounds - clear at anterior chest wall and clear at posterior chest wall. Adventitious sounds - No Adventitious sounds.  Cardiovascular Auscultation Rhythm - Regular rate and rhythm. Heart Sounds - S1 WNL and S2 WNL. Murmurs & Other Heart Sounds: Murmur 1 - Location - Aortic Area and Pulmonic Area. Timing - Holosystolic. Grade  - II/VI. Character - Low pitched.  Abdomen Inspection Contour - Generalized moderate distention. Palpation/Percussion Tenderness - Abdomen is non-tender to palpation. Rigidity (guarding) - Abdomen is soft. Auscultation Auscultation of the abdomen reveals - Bowel sounds normal.  Male Genitourinary Note: Not done, not pertinent to present illness   Musculoskeletal Note: On exam, he is alert and oriented in no apparent distress. The left knee no effusion. Varus deformity. Range is about 5-125 on both knees. He is tender medial greater than lateral with no instability noted. On exam he is alert and oriented. He is in no acute distress. He is tender along the medial and lateral jointlines. He does have a varus deformity. Significant patellofemoral crepitance. No effusion noted. Distal pulses 2+. Sensation and motor function are intact in the lower extremities.  RADIOGRAPHS: Radiographs are reviewed. AP and lateral of the left knee shows he has advanced arthritic change in the medial and patellofemoral compartments. (The right knee has since been replaced.)   Assessment & Plan Primary localized osteoarthritis of left knee (M17.12)  Lovenox 40MG /0.4ML, 1 (one) Solution daily, 4 Syringe, 02/27/2014, No Refill. Local Order: Sub-Q injections daily for four days prior to surgery. Take each morning on Feb. 18th through Feb. 21st. Note:Surgical Plans: Left Total Knee Repalcement  Disposition: Home  PCP: Dr. Quillian Quince  Topical TXA: CAD, DVT, PE  Please note that the patient was instructed to stop his Coumadin four days prior to surgery on February 22nd. The last dose of Coumadin with be on Wednesday March 17th. He will use Lovenox Bridge injections for four days prior to surgery (March 18th - March 21st). He will be placed back onto Coumadin postop along with Lovenox bridging until his Coumadin is back into a therapeutic range.  Anesthesia Issues: None  Signed electronically by  Joelene Millin, III PA-C

## 2014-03-10 NOTE — Interval H&P Note (Signed)
History and Physical Interval Note:  03/10/2014 6:39 AM  [name] [name]  has presented today for surgery, with the diagnosis of LEFT KNEE OA  The various methods of treatment have been discussed with the patient and family. After consideration of risks, benefits and other options for treatment, the patient has consented to  Procedure(s): LEFT TOTAL KNEE ARTHROPLASTY (Left) as a surgical intervention .  The patient's history has been reviewed, patient examined, no change in status, stable for surgery.  I have reviewed the patient's chart and labs.  Questions were answered to the patient's satisfaction.     [name]

## 2014-03-10 NOTE — Anesthesia Postprocedure Evaluation (Signed)
  Anesthesia Post-op Note  Patient: [name] [name]  Procedure(s) Performed: Procedure(s): LEFT TOTAL KNEE ARTHROPLASTY (Left)  Patient Location: PACU  Anesthesia Type: Spinal/MAC  Level of Consciousness: awake and alert   Airway and Oxygen Therapy: Patient Spontanous Breathing  Post-op Pain: none  Post-op Assessment: Post-op Vital signs reviewed, Patient's Cardiovascular Status Stable and Respiratory Function Stable. No residual motor block.  Post-op Vital Signs: Reviewed  Filed Vitals:   03/10/14 0900  BP: 155/73  Pulse: 49  Temp: 36.7 C  Resp: 14    Complications: No apparent anesthesia complications

## 2014-03-10 NOTE — Transfer of Care (Signed)
Immediate Anesthesia Transfer of Care Note  Patient: [name] [name]  Procedure(s) Performed: Procedure(s): LEFT TOTAL KNEE ARTHROPLASTY (Left)  Patient Location: PACU  Anesthesia Type:Spinal  Level of Consciousness: awake, alert  and oriented  Airway & Oxygen Therapy: Patient Spontanous Breathing and Patient connected to face mask oxygen  Post-op Assessment: Report given to RN and Post -op Vital signs reviewed and stable  Post vital signs: Reviewed and stable  Last Vitals:  Filed Vitals:   03/10/14 0457  BP: 159/86  Pulse: 52  Temp: 36.7 C  Resp: 16    Complications: No apparent anesthesia complications

## 2014-03-10 NOTE — Anesthesia Preprocedure Evaluation (Addendum)
Anesthesia Evaluation  Patient identified by MRN, date of birth, ID band Patient awake    Reviewed: Allergy & Precautions, H&P , NPO status , Patient's Chart, lab work & pertinent test results, reviewed documented beta blocker date and time   Airway Mallampati: III  TM Distance: >3 FB Neck ROM: Full    Dental no notable dental hx. (+) Poor Dentition, Dental Advisory Given   Pulmonary neg pulmonary ROS,  breath sounds clear to auscultation  Pulmonary exam normal       Cardiovascular hypertension, Pt. on medications and Pt. on home beta blockers + CAD, + CABG and + Peripheral Vascular Disease Rhythm:Regular Rate:Normal     Neuro/Psych negative neurological ROS  negative psych ROS   GI/Hepatic negative GI ROS, Neg liver ROS,   Endo/Other  Hypothyroidism   Renal/GU negative Renal ROS  negative genitourinary   Musculoskeletal  (+) Arthritis -, Osteoarthritis,    Abdominal   Peds  Hematology Protein S deficiency   Anesthesia Other Findings   Reproductive/Obstetrics negative OB ROS                            Anesthesia Physical Anesthesia Plan  ASA: III  Anesthesia Plan: MAC and Spinal   Post-op Pain Management:    Induction: Intravenous  Airway Management Planned: Simple Face Mask  Additional Equipment:   Intra-op Plan:   Post-operative Plan:   Informed Consent: I have reviewed the patients History and Physical, chart, labs and discussed the procedure including the risks, benefits and alternatives for the proposed anesthesia with the patient or authorized representative who has indicated his/her understanding and acceptance.   Dental advisory given  Plan Discussed with: CRNA  Anesthesia Plan Comments:        Anesthesia Quick Evaluation

## 2014-03-10 NOTE — Op Note (Signed)
Pre-operative diagnosis- Osteoarthritis  Left knee(s)  Post-operative diagnosis- Osteoarthritis Left knee(s)  Procedure-  Left  Total Knee Arthroplasty  Surgeon- [name] Plover. Romario Tith, MD  Assistant- Ardeen Jourdain, PA-C   Anesthesia-  Spinal  EBL-* No blood loss amount entered *   Drains Hemovac  Tourniquet time-  Total Tourniquet Time Documented: Thigh (Left) - 37 minutes Total: Thigh (Left) - 37 minutes     Complications- None  Condition-PACU - hemodynamically stable.   Brief Clinical Note   [name] [name] is a 74 y.o. year old male with end stage OA of his left knee with progressively worsening pain and dysfunction. He has constant pain, with activity and at rest and significant functional deficits with difficulties even with ADLs. He has had extensive non-op management including analgesics, injections of cortisone and viscosupplements, and home exercise program, but remains in significant pain with significant dysfunction. Radiographs show bone on bone arthritis medial and patelloefmroal. He presents now for left Total Knee Arthroplasty.     Procedure in detail---   The patient is brought into the operating room and positioned supine on the operating table. After successful administration of  Spinal,   a tourniquet is placed high on the  Left thigh(s) and the lower extremity is prepped and draped in the usual sterile fashion. Time out is performed by the operating team and then the  Left lower extremity is wrapped in Esmarch, knee flexed and the tourniquet inflated to 300 mmHg.       A midline incision is made with a ten blade through the subcutaneous tissue to the level of the extensor mechanism. A fresh blade is used to make a medial parapatellar arthrotomy. Soft tissue over the proximal medial tibia is subperiosteally elevated to the joint line with a knife and into the semimembranosus bursa with a Cobb elevator. Soft tissue over the proximal lateral tibia is elevated with  attention being paid to avoiding the patellar tendon on the tibial tubercle. The patella is everted, knee flexed 90 degrees and the ACL and PCL are removed. Findings are bone on bone medial and patellofemoral with large global osteophytes.        The drill is used to create a starting hole in the distal femur and the canal is thoroughly irrigated with sterile saline to remove the fatty contents. The 5 degree Left  valgus alignment guide is placed into the femoral canal and the distal femoral cutting block is pinned to remove 10 mm off the distal femur. Resection is made with an oscillating saw.      The tibia is subluxed forward and the menisci are removed. The extramedullary alignment guide is placed referencing proximally at the medial aspect of the tibial tubercle and distally along the second metatarsal axis and tibial crest. The block is pinned to remove 60mm off the more deficient medial  side. Resection is made with an oscillating saw. Size 4is the most appropriate size for the tibia and the proximal tibia is prepared with the modular drill and keel punch for that size.      The femoral sizing guide is placed and size 4 is most appropriate. Rotation is marked off the epicondylar axis and confirmed by creating a rectangular flexion gap at 90 degrees. The size 4 cutting block is pinned in this rotation and the anterior, posterior and chamfer cuts are made with the oscillating saw. The intercondylar block is then placed and that cut is made.      Trial size 4 tibial  component, trial size 4 posterior stabilized femur and a 12.5  mm posterior stabilized rotating platform insert trial is placed. Full extension is achieved with excellent varus/valgus and anterior/posterior balance throughout full range of motion. The patella is everted and thickness measured to be 24  mm. Free hand resection is taken to 14 mm, a 38 template is placed, lug holes are drilled, trial patella is placed, and it tracks normally.  Osteophytes are removed off the posterior femur with the trial in place. All trials are removed and the cut bone surfaces prepared with pulsatile lavage. Cement is mixed and once ready for implantation, the size 4 tibial implant, size  4 posterior stabilized femoral component, and the size 38 patella are cemented in place and the patella is held with the clamp. The trial insert is placed and the knee held in full extension. The Exparel (20 ml mixed with 30 ml saline) and .25% Bupivicaine, are injected into the extensor mechanism, posterior capsule, medial and lateral gutters and subcutaneous tissues.  All extruded cement is removed and once the cement is hard the permanent 12.5 mm posterior stabilized rotating platform insert is placed into the tibial tray.      The wound is copiously irrigated with saline solution and the extensor mechanism closed over a hemovac drain with #1 V-loc suture. The tourniquet is released for a total tourniquet time of 37  minutes. Flexion against gravity is 140 degrees and the patella tracks normally. Subcutaneous tissue is closed with 2.0 vicryl and subcuticular with running 4.0 Monocryl. The incision is cleaned and dried and steri-strips and a bulky sterile dressing are applied. The limb is placed into a knee immobilizer and the patient is awakened and transported to recovery in stable condition.      Please note that a surgical assistant was a medical necessity for this procedure in order to perform it in a safe and expeditious manner. Surgical assistant was necessary to retract the ligaments and vital neurovascular structures to prevent injury to them and also necessary for proper positioning of the limb to allow for anatomic placement of the prosthesis.   [name] Plover Jaquesha Boroff, MD    03/10/2014, 8:05 AM

## 2014-03-11 LAB — BASIC METABOLIC PANEL
Anion gap: 6 (ref 5–15)
BUN: 18 mg/dL (ref 6–23)
CALCIUM: 8.5 mg/dL (ref 8.4–10.5)
CO2: 24 mmol/L (ref 19–32)
CREATININE: 1.12 mg/dL (ref 0.50–1.35)
Chloride: 106 mmol/L (ref 96–112)
GFR calc Af Amer: 73 mL/min — ABNORMAL LOW (ref >90)
GFR, EST NON AFRICAN AMERICAN: 63 mL/min — AB (ref >90)
GLUCOSE: 222 mg/dL — AB (ref 70–99)
Potassium: 5 mmol/L (ref 3.5–5.1)
SODIUM: 136 mmol/L (ref 135–145)

## 2014-03-11 LAB — PROTIME-INR
INR: 1.21 (ref 0.00–1.49)
PROTHROMBIN TIME: 15.4 s — AB (ref 11.6–15.2)

## 2014-03-11 LAB — CBC
HEMATOCRIT: 41.8 % (ref 39.0–52.0)
HEMOGLOBIN: 13.9 g/dL (ref 13.0–17.0)
MCH: 31 pg (ref 26.0–34.0)
MCHC: 33.3 g/dL (ref 30.0–36.0)
MCV: 93.3 fL (ref 78.0–100.0)
Platelets: 214 10*3/uL (ref 150–400)
RBC: 4.48 MIL/uL (ref 4.22–5.81)
RDW: 14.2 % (ref 11.5–15.5)
WBC: 18.4 10*3/uL — ABNORMAL HIGH (ref 4.0–10.5)

## 2014-03-11 MED ORDER — WARFARIN SODIUM 7.5 MG PO TABS
7.5000 mg | ORAL_TABLET | Freq: Once | ORAL | Status: AC
  Administered 2014-03-11: 7.5 mg via ORAL
  Filled 2014-03-11: qty 1

## 2014-03-11 NOTE — Progress Notes (Signed)
   Subjective: 1 Day Post-Op Procedure(s) (LRB): LEFT TOTAL KNEE ARTHROPLASTY (Left) Patient reports pain as mild.   Patient seen in rounds for Dr. Wynelle Link. Patient is well, and has had no acute complaints or problems We will resume therapy today. He walked 90 the day of surgery. Plan is to go Home after hospital stay.  Objective: Vital signs in last 24 hours: Temp:  [97.6 F (36.4 C)-99 F (37.2 C)] 98.1 F (36.7 C) (02/23 0536) Pulse Rate:  [51-72] 51 (02/23 0536) Resp:  [14-16] 16 (02/23 0536) BP: (130-184)/(58-92) 138/61 mmHg (02/23 0536) SpO2:  [93 %-95 %] 93 % (02/23 0536)  Intake/Output from previous day:  Intake/Output Summary (Last 24 hours) at 03/11/14 0941 Last data filed at 03/11/14 0842  Gross per 24 hour  Intake   1870 ml  Output   2545 ml  Net   -675 ml    Intake/Output this shift: Total I/O In: 240 [P.O.:240] Out: -   Labs:  Recent Labs  03/11/14 0504  HGB 13.9    Recent Labs  03/11/14 0504  WBC 18.4*  RBC 4.48  HCT 41.8  PLT 214    Recent Labs  03/11/14 0504  NA 136  K 5.0  CL 106  CO2 24  BUN 18  CREATININE 1.12  GLUCOSE 222*  CALCIUM 8.5    Recent Labs  03/10/14 0528 03/11/14 0504  INR 1.14 1.21    EXAM General - Patient is Alert, Appropriate and Oriented Extremity - Neurovascular intact Sensation intact distally Dorsiflexion/Plantar flexion intact Dressing - dressing C/D/I Motor Function - intact, moving foot and toes well on exam.  Hemovac pulled without difficulty.  Past Medical History  Diagnosis Date  . Hypertension   . Peripheral vascular disease     hx phelbitis  . Arthritis   . Blood dyscrasia     protein S deficiency  . Tibia fracture     no surgery- got phlebitis at leg under cast   . DVT (deep venous thrombosis)     lungs, legs, arms at various times- Protein S deficiency  . Shortness of breath dyspnea   . Pneumonia     hx of pneumonia x 2   . Hypothyroidism     Assessment/Plan: 1 Day  Post-Op Procedure(s) (LRB): LEFT TOTAL KNEE ARTHROPLASTY (Left) Active Problems:   OA (osteoarthritis) of knee  Estimated body mass index is 36.46 kg/(m^2) as calculated from the following:   Height as of this encounter: 5\' 9"  (1.753 m).   Weight as of this encounter: 112.038 kg (247 lb). Advance diet Up with therapy Plan for discharge tomorrow Discharge home with home health  DVT Prophylaxis - Lovenox and Coumadin  INR 1.21 Weight-Bearing as tolerated to left leg D/C O2 and Pulse OX and try on Room Air  Arlee Muslim, PA-C Orthopaedic Surgery 03/11/2014, 9:41 AM

## 2014-03-11 NOTE — Progress Notes (Signed)
Physical Therapy Treatment Patient Details Name: [name] [name] MRN: [id number] DOB: 01-Jan-1941 Today's Date: 04-09-14    History of Present Illness s/p L TKA    PT Comments    Pt progressing  Follow Up Recommendations  Home health PT     Equipment Recommendations  None recommended by PT    Recommendations for Other Services       Precautions / Restrictions Precautions Precautions: Knee Required Braces or Orthoses: Knee Immobilizer - Left Knee Immobilizer - Left: Discontinue once straight leg raise with < 10 degree lag Restrictions Other Position/Activity Restrictions: WBAT    Mobility  Bed Mobility                  Transfers Overall transfer level: Needs assistance Equipment used: Rolling walker (2 wheeled) Transfers: Sit to/from Stand Sit to Stand: Min guard         General transfer comment: cues for hand placement and control of descent  Ambulation/Gait Ambulation/Gait assistance: Min guard;Min assist Ambulation Distance (Feet): 140 Feet Assistive device: Rolling walker (2 wheeled) Gait Pattern/deviations: Step-to pattern;Step-through pattern     General Gait Details: cues for sequence, RW safety   Stairs            Wheelchair Mobility    Modified Rankin (Stroke Patients Only)       Balance                                    Cognition Arousal/Alertness: Awake/alert Behavior During Therapy: WFL for tasks assessed/performed Overall Cognitive Status: Within Functional Limits for tasks assessed                      Exercises Total Joint Exercises Ankle Circles/Pumps: AROM;Both;10 reps Quad Sets: 10 reps;AROM;Both Heel Slides: AROM;10 reps;Left;AAROM Hip ABduction/ADduction: Left;Strengthening;AROM Straight Leg Raises: AROM;AAROM;Left;10 reps    General Comments        Pertinent Vitals/Pain Pain Assessment: 0-10 Pain Score: 3  Pain Location: L knee Pain Descriptors / Indicators: Aching Pain  Intervention(s): Limited activity within patient's tolerance;Monitored during session;Ice applied    Home Living                      Prior Function            PT Goals (current goals can now be found in the care plan section) Acute Rehab PT Goals Patient Stated Goal: home soon PT Goal Formulation: With patient Time For Goal Achievement: 03/14/14 Potential to Achieve Goals: Good Progress towards PT goals: Progressing toward goals    Frequency  7X/week    PT Plan Current plan remains appropriate    Co-evaluation             End of Session Equipment Utilized During Treatment: Gait belt Activity Tolerance: Patient tolerated treatment well Patient left: in bed;with call bell/phone within reach;with family/visitor present     Time: 5170-0174 PT Time Calculation (min) (ACUTE ONLY): 18 min  Charges:  $Gait Training: 8-22 mins                    G Codes:      [name] [name] Apr 09, 2014, 12:01 PM

## 2014-03-11 NOTE — Progress Notes (Signed)
Physical Therapy Treatment Patient Details Name: [name] [name] MRN: [id number] DOB: 02-Jul-1940 Today's Date: 03/11/2014    History of Present Illness s/p L TKA    PT Comments    POD # 1 pm session.  Assisted pt OOB.  Pt declined to wear KI when I tried to apply.  Assisted with amb in hallway an increased distance then back to recliner as lunch tray arrived.  Instructed pt on proper positioning L LE in extension and used a rolled blanket to bloch hip from externally rotating which flexes knee.  Applied ICE.  Follow Up Recommendations  Home health PT     Equipment Recommendations  None recommended by PT    Recommendations for Other Services       Precautions / Restrictions Precautions Precautions: Knee Precaution Comments: pt declined to wear KI Restrictions Weight Bearing Restrictions: No Other Position/Activity Restrictions: WBAT    Mobility  Bed Mobility Overal bed mobility: Needs Assistance Bed Mobility: Supine to Sit;Sit to Supine     Supine to sit: Min guard Sit to supine: Min guard   General bed mobility comments: cues for technique and increased time  Transfers Overall transfer level: Needs assistance Equipment used: Rolling walker (2 wheeled) Transfers: Sit to/from Stand Sit to Stand: Min guard         General transfer comment: cues for hand placement and control of descent  Ambulation/Gait Ambulation/Gait assistance: Min assist;Min guard Ambulation Distance (Feet): 65 Feet Assistive device: Rolling walker (2 wheeled) Gait Pattern/deviations: Step-to pattern;Decreased step length - left;Trunk flexed Gait velocity: decreased   General Gait Details: cues for sequence, RW safety   Stairs            Wheelchair Mobility    Modified Rankin (Stroke Patients Only)       Balance                                    Cognition Arousal/Alertness: Awake/alert Behavior During Therapy: WFL for tasks assessed/performed Overall  Cognitive Status: Within Functional Limits for tasks assessed                      Exercises      General Comments        Pertinent Vitals/Pain Pain Assessment: 0-10 Pain Score: 7  Pain Location: L knee Pain Descriptors / Indicators: Aching;Sore Pain Intervention(s): Monitored during session;Premedicated before session;Repositioned;Ice applied    Home Living                      Prior Function            PT Goals (current goals can now be found in the care plan section) Progress towards PT goals: Progressing toward goals    Frequency  7X/week    PT Plan Current plan remains appropriate    Co-evaluation             End of Session Equipment Utilized During Treatment: Gait belt Activity Tolerance: Patient tolerated treatment well Patient left: in bed;with call bell/phone within reach;with family/visitor present     Time: 1355-1414 PT Time Calculation (min) (ACUTE ONLY): 19 min  Charges:  $Gait Training: 8-22 mins                    G Codes:      Rica Koyanagi  PTA [facility]  Acute  Rehab Pager  [phone]  

## 2014-03-11 NOTE — Progress Notes (Signed)
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 Ace Inhibitors     Cramps, Weak muscles.   . Lipitor [Atorvastatin] Other (See Comments)    Muscle weakness  . Sulfa Antibiotics Hives    Patient Measurements: Height: 5\' 9"  (175.3 cm) Weight: 247 lb (112.038 kg) IBW/kg (Calculated) : 70.7 Heparin Dosing Weight:   Vital Signs: Temp: 98.1 F (36.7 C) (02/23 0536) Temp Source: Oral (02/23 0536) BP: 157/67 mmHg (02/23 0942) Pulse Rate: 60 (02/23 0942)  Labs:  Recent Labs  03/10/14 0528 03/11/14 0504  HGB  --  13.9  HCT  --  41.8  PLT  --  214  LABPROT 14.8 15.4*  INR 1.14 1.21  CREATININE  --  1.12    Estimated Creatinine Clearance: 72.5 mL/min (by C-G formula based on Cr of 1.12).   Medical History: Past Medical History  Diagnosis Date  . Hypertension   . Peripheral vascular disease     hx phelbitis  . Arthritis   . Blood dyscrasia     protein S deficiency  . Tibia fracture     no surgery- got phlebitis at leg under cast   . DVT (deep venous thrombosis)     lungs, legs, arms at various times- Protein S deficiency  . Shortness of breath dyspnea   . Pneumonia     hx of pneumonia x 2   . Hypothyroidism    Assessment: [name] s/p L TKA 2/22.  He has history of protein S deficiency with prior DVT/PE.  Pharmacy asked to continue warfarin therapy.  He is also prescribed enoxaparin 30mg  SQ q12h to begin POD#1.  He had [name] performed in October 2015   Home dose:  7.5mg  daily  Today, 03/11/2014:   INR subtherapeutic as expected POD1 and 7.5mg  warfarin last PM but INR did increase some already  CBC: stable  No reported bleeding  Goal of Therapy:  INR 2-3 Monitor platelets by anticoagulation protocol: Yes   Plan:   Repeat warfarin 7.5mg  PO x 1 dose tonight (INR trended up nicely with this dosing in Oct but appears maintenance dose  has increased since then so may need higher dose)  Daily INR  Continue enoxaparin until INR >= 1.8  Monitor for bleeding   Adrian Saran, PharmD, BCPS Pager [phone] 03/11/2014 11:38 AM

## 2014-03-11 NOTE — Care Management Note (Signed)
    Page 1 of 1   03/11/2014     3:09:03 PM CARE MANAGEMENT NOTE 03/11/2014  Patient:  [name] [name],[name] [name]   Account Number:  [id number]  Date Initiated:  03/11/2014  Documentation initiated by:  [facility]  Subjective/Objective Assessment:   adm: LEFT TOTAL KNEE ARTHROPLASTY (Left)     Action/Plan:   discharge planning   Anticipated DC Date:  03/12/2014   Anticipated DC Plan:  [place]  CM consult      [facility] Choice  HOME HEALTH   Choice offered to / List presented to:  C-1 Patient        [place] arranged  HH-2 PT      [place]   Status of service:  Completed, signed off Medicare Important Message given?   (If response is "NO", the following Medicare IM given date fields will be blank) Date Medicare IM given:   Medicare IM given by:   Date Additional Medicare IM given:   Additional Medicare IM given by:    Discharge Disposition:  [place]  Per UR Regulation:    If discussed at Long Length of Stay Meetings, dates discussed:    Comments:  03/11/14 15:00 CM met with pt in room to offer choice of home health agency.  Pt chooses [facility] to render HHPT. Address and contact informaiton verified with pt.  Referral emailed to [facility] rep, tim noting mobile number preference.  No DME needed as pt has bothe 3n1 and rolling walker.  No other CM needs wree communicated.  Mariane Masters, BSN, CM [phone].

## 2014-03-11 NOTE — Progress Notes (Signed)
OT Cancellation Note  Patient Details Name: [name] [name] MRN: [id number] DOB: Sep 22, 1940   Cancelled Treatment:    Reason Eval/Treat Not Completed: OT screened, no needs identified, will sign off. Pt and wife verbalize they feel comfortable with [facility] for d/c. He had a recent knee in Oct last year and feel comfortable with how to perform self care. Wife states she can assist and they have all DME.  [place], [place] 03/11/2014, 12:33 PM

## 2014-03-12 LAB — BASIC METABOLIC PANEL
ANION GAP: 6 (ref 5–15)
BUN: 23 mg/dL (ref 6–23)
CO2: 27 mmol/L (ref 19–32)
CREATININE: 1.13 mg/dL (ref 0.50–1.35)
Calcium: 9 mg/dL (ref 8.4–10.5)
Chloride: 103 mmol/L (ref 96–112)
GFR calc Af Amer: 73 mL/min — ABNORMAL LOW (ref >90)
GFR calc non Af Amer: 63 mL/min — ABNORMAL LOW (ref >90)
GLUCOSE: 136 mg/dL — AB (ref 70–99)
Potassium: 4.9 mmol/L (ref 3.5–5.1)
SODIUM: 136 mmol/L (ref 135–145)

## 2014-03-12 LAB — CBC
HEMATOCRIT: 42 % (ref 39.0–52.0)
Hemoglobin: 13.5 g/dL (ref 13.0–17.0)
MCH: 30.5 pg (ref 26.0–34.0)
MCHC: 32.1 g/dL (ref 30.0–36.0)
MCV: 94.8 fL (ref 78.0–100.0)
Platelets: 222 10*3/uL (ref 150–400)
RBC: 4.43 MIL/uL (ref 4.22–5.81)
RDW: 14.7 % (ref 11.5–15.5)
WBC: 18.6 10*3/uL — ABNORMAL HIGH (ref 4.0–10.5)

## 2014-03-12 LAB — PROTIME-INR
INR: 1.31 (ref 0.00–1.49)
PROTHROMBIN TIME: 16.4 s — AB (ref 11.6–15.2)

## 2014-03-12 MED ORDER — METHOCARBAMOL 500 MG PO TABS: 500.0000 mg | ORAL_TABLET | Freq: Four times a day (QID) | ORAL | Status: AC | PRN

## 2014-03-12 MED ORDER — HYDROCODONE-ACETAMINOPHEN 5-325 MG PO TABS
1.0000 | ORAL_TABLET | ORAL | Status: DC | PRN
Start: 2014-03-12 — End: 2014-03-12
  Administered 2014-03-12: 2 via ORAL
  Filled 2014-03-12: qty 2

## 2014-03-12 MED ORDER — HYDROCODONE-ACETAMINOPHEN 5-325 MG PO TABS: 1.0000 | ORAL_TABLET | ORAL | Status: AC | PRN

## 2014-03-12 MED ORDER — ONDANSETRON HCL 4 MG PO TABS: 4.0000 mg | ORAL_TABLET | Freq: Four times a day (QID) | ORAL | Status: AC | PRN

## 2014-03-12 MED ORDER — TRAMADOL HCL 50 MG PO TABS: 50.0000 mg | ORAL_TABLET | Freq: Four times a day (QID) | ORAL | Status: AC | PRN

## 2014-03-12 MED ORDER — ENOXAPARIN SODIUM 30 MG/0.3ML ~~LOC~~ SOLN: 30.0000 mg | Freq: Two times a day (BID) | SUBCUTANEOUS | Status: AC

## 2014-03-12 MED ORDER — WARFARIN SODIUM 5 MG PO TABS: 5.0000 mg | ORAL_TABLET | Freq: Every morning | ORAL | Status: AC

## 2014-03-12 NOTE — Progress Notes (Signed)
   Subjective: 2 Days Post-Op Procedure(s) (LRB): LEFT TOTAL KNEE ARTHROPLASTY (Left) Patient reports pain as mild.   Patient seen in rounds with Dr. Wynelle Link. [name] [name] in room Patient is well, and has had no acute complaints or problems Patient is ready to go home  Objective: Vital signs in last 24 hours: Temp:  [97 F (36.1 C)-98.4 F (36.9 C)] 97 F (36.1 C) (02/24 0439) Pulse Rate:  [52-70] 52 (02/24 0439) Resp:  [15-20] 20 (02/24 0439) BP: (157-197)/(67-96) 172/96 mmHg (02/24 0439) SpO2:  [90 %-97 %] 97 % (02/24 0439)  Intake/Output from previous day:  Intake/Output Summary (Last 24 hours) at 03/12/14 0701 Last data filed at 03/11/14 2300  Gross per 24 hour  Intake    720 ml  Output   1050 ml  Net   -330 ml     Labs:  Recent Labs  03/11/14 0504 03/12/14 0512  HGB 13.9 13.5    Recent Labs  03/11/14 0504 03/12/14 0512  WBC 18.4* 18.6*  RBC 4.48 4.43  HCT 41.8 42.0  PLT 214 222    Recent Labs  03/11/14 0504 03/12/14 0512  NA 136 136  K 5.0 4.9  CL 106 103  CO2 24 27  BUN 18 23  CREATININE 1.12 1.13  GLUCOSE 222* 136*  CALCIUM 8.5 9.0    Recent Labs  03/11/14 0504 03/12/14 0512  INR 1.21 1.31    EXAM: General - Patient is Alert, Appropriate and Oriented Extremity - Neurovascular intact Sensation intact distally Dorsiflexion/Plantar flexion intact Incision - clean, dry, no drainage, healing Motor Function - intact, moving foot and toes well on exam.   Assessment/Plan: 2 Days Post-Op Procedure(s) (LRB): LEFT TOTAL KNEE ARTHROPLASTY (Left) Procedure(s) (LRB): LEFT TOTAL KNEE ARTHROPLASTY (Left) Past Medical History  Diagnosis Date  . Hypertension   . Peripheral vascular disease     hx phelbitis  . Arthritis   . Blood dyscrasia     protein S deficiency  . Tibia fracture     no surgery- got phlebitis at leg under cast   . DVT (deep venous thrombosis)     lungs, legs, arms at various times- Protein S deficiency  . Shortness of  breath dyspnea   . Pneumonia     hx of pneumonia x 2   . Hypothyroidism    Active Problems:   OA (osteoarthritis) of knee  Estimated body mass index is 36.46 kg/(m^2) as calculated from the following:   Height as of this encounter: 5\' 9"  (1.753 m).   Weight as of this encounter: 112.038 kg (247 lb). Up with therapy Discharge home with home health Diet - Cardiac diet Follow up - in 2 weeks Activity - WBAT Disposition - Home Condition Upon Discharge - Good D/C Meds - See DC Summary DVT Prophylaxis - Lovenox and Coumadin  Arlee Muslim, PA-C Orthopaedic Surgery 03/12/2014, 7:01 AM

## 2014-03-12 NOTE — Discharge Summary (Signed)
Physician Discharge Summary   Patient ID: [name] [name] MRN: [id number] DOB/AGE: [birth year] 74 y.o.  Admit date: 03/10/2014 Discharge date: 03/12/2014  Primary Diagnosis:  Osteoarthritis Left knee(s)  Admission Diagnoses:  Past Medical History  Diagnosis Date  . Hypertension   . Peripheral vascular disease     hx phelbitis  . Arthritis   . Blood dyscrasia     protein S deficiency  . Tibia fracture     no surgery- got phlebitis at leg under cast   . DVT (deep venous thrombosis)     lungs, legs, arms at various times- Protein S deficiency  . Shortness of breath dyspnea   . Pneumonia     hx of pneumonia x 2   . Hypothyroidism    Discharge Diagnoses:   Active Problems:   OA (osteoarthritis) of knee  Estimated body mass index is 36.46 kg/(m^2) as calculated from the following:   Height as of this encounter: 5' 9"  (1.753 m).   Weight as of this encounter: 112.038 kg (247 lb).  Procedure:  Procedure(s) (LRB): LEFT TOTAL KNEE ARTHROPLASTY (Left)   Consults: None  HPI: [name] is a 74 y.o. year old male with end stage OA of his left knee with progressively worsening pain and dysfunction. He has constant pain, with activity and at rest and significant functional deficits with difficulties even with ADLs. He has had extensive non-op management including analgesics, injections of cortisone and viscosupplements, and home exercise program, but remains in significant pain with significant dysfunction. Radiographs show bone on bone arthritis medial and patelloefmroal. He presents now for left Total Knee Arthroplasty.   Laboratory Data: Admission on 03/10/2014, Discharged on 03/12/2014  Component Date Value Ref Range Status  . Prothrombin Time 03/10/2014 14.8  11.6 - 15.2 seconds Final  . INR 03/10/2014 1.14  0.00 - 1.49 Final  . WBC 03/11/2014 18.4* 4.0 - 10.5 K/uL Final  . RBC 03/11/2014 4.48  4.22 - 5.81 MIL/uL Final  . Hemoglobin 03/11/2014 13.9  13.0 - 17.0 g/dL  Final  . HCT 03/11/2014 41.8  39.0 - 52.0 % Final  . MCV 03/11/2014 93.3  78.0 - 100.0 fL Final  . MCH 03/11/2014 31.0  26.0 - 34.0 pg Final  . MCHC 03/11/2014 33.3  30.0 - 36.0 g/dL Final  . RDW 03/11/2014 14.2  11.5 - 15.5 % Final  . Platelets 03/11/2014 214  150 - 400 K/uL Final  . Sodium 03/11/2014 136  135 - 145 mmol/L Final  . Potassium 03/11/2014 5.0  3.5 - 5.1 mmol/L Final  . Chloride 03/11/2014 106  96 - 112 mmol/L Final  . CO2 03/11/2014 24  19 - 32 mmol/L Final  . Glucose, Bld 03/11/2014 222* 70 - 99 mg/dL Final  . BUN 03/11/2014 18  6 - 23 mg/dL Final  . Creatinine, Ser 03/11/2014 1.12  0.50 - 1.35 mg/dL Final  . Calcium 03/11/2014 8.5  8.4 - 10.5 mg/dL Final  . GFR calc non Af Amer 03/11/2014 63* >90 mL/min Final  . GFR calc Af Amer 03/11/2014 73* >90 mL/min Final   Comment: (NOTE) The eGFR has been calculated using the CKD EPI equation. This calculation has not been validated in all clinical situations. eGFR's persistently <90 mL/min signify possible Chronic Kidney Disease.   . Anion gap 03/11/2014 6  5 - 15 Final  . Prothrombin Time 03/11/2014 15.4* 11.6 - 15.2 seconds Final  . INR 03/11/2014 1.21  0.00 - 1.49 Final  . WBC 03/12/2014 18.6* 4.0 -  10.5 K/uL Final  . RBC 03/12/2014 4.43  4.22 - 5.81 MIL/uL Final  . Hemoglobin 03/12/2014 13.5  13.0 - 17.0 g/dL Final  . HCT 03/12/2014 42.0  39.0 - 52.0 % Final  . MCV 03/12/2014 94.8  78.0 - 100.0 fL Final  . MCH 03/12/2014 30.5  26.0 - 34.0 pg Final  . MCHC 03/12/2014 32.1  30.0 - 36.0 g/dL Final  . RDW 03/12/2014 14.7  11.5 - 15.5 % Final  . Platelets 03/12/2014 222  150 - 400 K/uL Final  . Sodium 03/12/2014 136  135 - 145 mmol/L Final  . Potassium 03/12/2014 4.9  3.5 - 5.1 mmol/L Final  . Chloride 03/12/2014 103  96 - 112 mmol/L Final  . CO2 03/12/2014 27  19 - 32 mmol/L Final  . Glucose, Bld 03/12/2014 136* 70 - 99 mg/dL Final  . BUN 03/12/2014 23  6 - 23 mg/dL Final  . Creatinine, Ser 03/12/2014 1.13  0.50 -  1.35 mg/dL Final  . Calcium 03/12/2014 9.0  8.4 - 10.5 mg/dL Final  . GFR calc non Af Amer 03/12/2014 63* >90 mL/min Final  . GFR calc Af Amer 03/12/2014 73* >90 mL/min Final   Comment: (NOTE) The eGFR has been calculated using the CKD EPI equation. This calculation has not been validated in all clinical situations. eGFR's persistently <90 mL/min signify possible Chronic Kidney Disease.   . Anion gap 03/12/2014 6  5 - 15 Final  . Prothrombin Time 03/12/2014 16.4* 11.6 - 15.2 seconds Final  . INR 03/12/2014 1.31  0.00 - 1.49 Final  Hospital Outpatient Visit on 03/05/2014  Component Date Value Ref Range Status  . aPTT 03/05/2014 42* 24 - 37 seconds Final   Comment:        IF BASELINE aPTT IS ELEVATED, SUGGEST PATIENT RISK ASSESSMENT BE USED TO DETERMINE APPROPRIATE ANTICOAGULANT THERAPY.   . WBC 03/05/2014 11.0* 4.0 - 10.5 K/uL Final  . RBC 03/05/2014 5.06  4.22 - 5.81 MIL/uL Final  . Hemoglobin 03/05/2014 16.2  13.0 - 17.0 g/dL Final  . HCT 03/05/2014 47.0  39.0 - 52.0 % Final  . MCV 03/05/2014 92.9  78.0 - 100.0 fL Final  . MCH 03/05/2014 32.0  26.0 - 34.0 pg Final  . MCHC 03/05/2014 34.5  30.0 - 36.0 g/dL Final  . RDW 03/05/2014 14.5  11.5 - 15.5 % Final  . Platelets 03/05/2014 204  150 - 400 K/uL Final  . Sodium 03/05/2014 140  135 - 145 mmol/L Final  . Potassium 03/05/2014 4.5  3.5 - 5.1 mmol/L Final  . Chloride 03/05/2014 106  96 - 112 mmol/L Final  . CO2 03/05/2014 25  19 - 32 mmol/L Final  . Glucose, Bld 03/05/2014 98  70 - 99 mg/dL Final  . BUN 03/05/2014 31* 6 - 23 mg/dL Final  . Creatinine, Ser 03/05/2014 1.27  0.50 - 1.35 mg/dL Final  . Calcium 03/05/2014 9.7  8.4 - 10.5 mg/dL Final  . Total Protein 03/05/2014 7.9  6.0 - 8.3 g/dL Final  . Albumin 03/05/2014 4.0  3.5 - 5.2 g/dL Final  . AST 03/05/2014 32  0 - 37 U/L Final  . ALT 03/05/2014 30  0 - 53 U/L Final  . Alkaline Phosphatase 03/05/2014 43  39 - 117 U/L Final  . Total Bilirubin 03/05/2014 0.7  0.3 - 1.2  mg/dL Final  . GFR calc non Af Amer 03/05/2014 54* >90 mL/min Final  . GFR calc Af Amer 03/05/2014 63* >90 mL/min Final  Comment: (NOTE) The eGFR has been calculated using the CKD EPI equation. This calculation has not been validated in all clinical situations. eGFR's persistently <90 mL/min signify possible Chronic Kidney Disease.   . Anion gap 03/05/2014 9  5 - 15 Final  . Prothrombin Time 03/05/2014 23.6* 11.6 - 15.2 seconds Final  . INR 03/05/2014 2.09* 0.00 - 1.49 Final  . ABO/RH(D) 03/05/2014 A POS   Final  . Antibody Screen 03/05/2014 NEG   Final  . Sample Expiration 03/05/2014 03/13/2014   Final  . Color, Urine 03/05/2014 AMBER* YELLOW Final   BIOCHEMICALS MAY BE AFFECTED BY COLOR  . APPearance 03/05/2014 CLEAR  CLEAR Final  . Specific Gravity, Urine 03/05/2014 1.031* 1.005 - 1.030 Final  . pH 03/05/2014 5.5  5.0 - 8.0 Final  . Glucose, UA 03/05/2014 NEGATIVE  NEGATIVE mg/dL Final  . Hgb urine dipstick 03/05/2014 TRACE* NEGATIVE Final  . Bilirubin Urine 03/05/2014 NEGATIVE  NEGATIVE Final  . Ketones, ur 03/05/2014 NEGATIVE  NEGATIVE mg/dL Final  . Protein, ur 03/05/2014 NEGATIVE  NEGATIVE mg/dL Final  . Urobilinogen, UA 03/05/2014 1.0  0.0 - 1.0 mg/dL Final  . Nitrite 03/05/2014 NEGATIVE  NEGATIVE Final  . Leukocytes, UA 03/05/2014 NEGATIVE  NEGATIVE Final  . MRSA, PCR 03/05/2014 NEGATIVE  NEGATIVE Final  . Staphylococcus aureus 03/05/2014 NEGATIVE  NEGATIVE Final   Comment:        The Xpert SA Assay (FDA approved for NASAL specimens in patients over 17 years of age), is one component of a comprehensive surveillance program.  Test performance has been validated by [facility] for patients greater than or equal to 67 year old. It is not intended to diagnose infection nor to guide or monitor treatment.   . Squamous Epithelial / LPF 03/05/2014 RARE  RARE Final  . WBC, UA 03/05/2014 0-2  <3 WBC/hpf Final  . RBC / HPF 03/05/2014 3-6  <3 RBC/hpf Final  . Bacteria, UA  03/05/2014 RARE  RARE Final     X-Rays:No results found.  EKG: Orders placed or performed during the hospital encounter of 10/14/13  . EKG 12-Lead  . EKG 12-Lead     Hospital Course: Antwaun Buth is a 74 y.o. who was admitted to [facility]. They were brought to the operating room on 03/10/2014 and underwent Procedure(s): LEFT TOTAL KNEE ARTHROPLASTY.  Patient tolerated the procedure well and was later transferred to the recovery room and then to the orthopaedic floor for postoperative care.  They were given PO and IV analgesics for pain control following their surgery.  They were given 24 hours of postoperative antibiotics of  Anti-infectives    Start     Dose/Rate Route Frequency Ordered Stop   03/10/14 1400  ceFAZolin (ANCEF) IVPB 2 g/50 mL premix     2 g 100 mL/hr over 30 Minutes Intravenous Every 6 hours 03/10/14 0935 03/10/14 1950   03/10/14 0458  ceFAZolin (ANCEF) IVPB 2 g/50 mL premix     2 g 100 mL/hr over 30 Minutes Intravenous On call to O.R. 03/10/14 5329 03/10/14 0708     and started on DVT prophylaxis in the form of Lovenox and Coumadin.   PT and OT were ordered for total joint protocol.  Discharge planning consulted to help with postop disposition and equipment needs.  Patient had a good night on the evening of surgery and walked 90 feet the day of surgery.  They started to get up OOB with therapy on day one. Hemovac drain was pulled  without difficulty.  Continued to work with therapy into day two.  Dressing was changed on day two and the incision was healing well.  Patient was seen in rounds and was ready to go home.  Discharge home with home health Diet - Cardiac diet Follow up - in 2 weeks Activity - WBAT Disposition - Home Condition Upon Discharge - Good D/C Meds - See DC Summary DVT Prophylaxis - Lovenox and Coumadin      Discharge Instructions    Call MD / Call 911    Complete by:  As directed   If you experience chest pain or shortness of  breath, CALL 911 and be transported to the hospital emergency room.  If you develope a fever above 101 F, pus (white drainage) or increased drainage or redness at the wound, or calf pain, call your surgeon's office.     Change dressing    Complete by:  As directed   Change dressing daily with sterile 4 x 4 inch gauze dressing and apply TED hose. Do not submerge the incision under water.     Constipation Prevention    Complete by:  As directed   Drink plenty of fluids.  Prune juice may be helpful.  You may use a stool softener, such as Colace (over the counter) 100 mg twice a day.  Use MiraLax (over the counter) for constipation as needed.     Diet - low sodium heart healthy    Complete by:  As directed      Discharge instructions    Complete by:  As directed   Pick up stool softner and laxative for home use following surgery while on pain medications. Do not submerge incision under water. Please use good hand washing techniques while changing dressing each day. May shower starting three days after surgery. Please use a clean towel to pat the incision dry following showers. Continue to use ice for pain and swelling after surgery. Do not use any lotions or creams on the incision until instructed by your surgeon.   Take Coumadin for three weeks for postoperative protocol and then the patient may resume their previous Coumadin home regimen.  The dose may need to be adjusted based upon the INR.  Please follow the INR and titrate Coumadin dose for a therapeutic range between 2.0 and 3.0 INR.  After completing the three weeks of Coumadin, the patient may resume their previous Coumadin home regimen.  Continue Lovenox injections until the INR is therapeutic at or greater than 2.0.  When INR reaches the therapeutic level of equal to or greater than 2.0, the patient may discontinue the Lovenox injections.   Postoperative Constipation Protocol  Constipation - defined medically as fewer than three  stools per week and severe constipation as less than one stool per week.  One of the most common issues patients have following surgery is constipation.  Even if you have a regular bowel pattern at home, your normal regimen is likely to be disrupted due to multiple reasons following surgery.  Combination of anesthesia, postoperative narcotics, change in appetite and fluid intake all can affect your bowels.  In order to avoid complications following surgery, here are some recommendations in order to help you during your recovery period.  Colace (docusate) - Pick up an over-the-counter form of Colace or another stool softener and take twice a day as long as you are requiring postoperative pain medications.  Take with a full glass of water daily.  If you experience loose  stools or diarrhea, hold the colace until you stool forms back up.  If your symptoms do not get better within 1 week or if they get worse, check with your doctor.  Dulcolax (bisacodyl) - Pick up over-the-counter and take as directed by the product packaging as needed to assist with the movement of your bowels.  Take with a full glass of water.  Use this product as needed if not relieved by Colace only.   MiraLax (polyethylene glycol) - Pick up over-the-counter to have on hand.  MiraLax is a solution that will increase the amount of water in your bowels to assist with bowel movements.  Take as directed and can mix with a glass of water, juice, soda, coffee, or tea.  Take if you go more than two days without a movement. Do not use MiraLax more than once per day. Call your doctor if you are still constipated or irregular after using this medication for 7 days in a row.  If you continue to have problems with postoperative constipation, please contact the office for further assistance and recommendations.  If you experience "the worst abdominal pain ever" or develop nausea or vomiting, please contact the office immediatly for further  recommendations for treatment.     Do not put a pillow under the knee. Place it under the heel.    Complete by:  As directed      Do not sit on low chairs, stoools or toilet seats, as it may be difficult to get up from low surfaces    Complete by:  As directed      Driving restrictions    Complete by:  As directed   No driving until released by the physician.     Increase activity slowly as tolerated    Complete by:  As directed      Lifting restrictions    Complete by:  As directed   No lifting until released by the physician.     Patient may shower    Complete by:  As directed   You may shower without a dressing once there is no drainage.  Do not wash over the wound.  If drainage remains, do not shower until drainage stops.     TED hose    Complete by:  As directed   Use stockings (TED hose) for 3 weeks on both leg(s).  You may remove them at night for sleeping.     Weight bearing as tolerated    Complete by:  As directed   Laterality:  left  Extremity:  Lower            Medication List    STOP taking these medications        aspirin 325 MG tablet     multivitamin tablet     OVER THE COUNTER MEDICATION     oxyCODONE 5 MG immediate release tablet  Commonly known as:  Oxy IR/ROXICODONE      TAKE these medications        acetaminophen 325 MG tablet  Commonly known as:  TYLENOL  Take 650 mg by mouth as needed.     bisacodyl 10 MG suppository  Commonly known as:  DULCOLAX  Place 1 suppository (10 mg total) rectally daily as needed for moderate constipation.     DSS 100 MG Caps  Take 100 mg by mouth 2 (two) times daily.     enoxaparin 30 MG/0.3ML injection  Commonly known as:  LOVENOX  Inject 0.3 mLs (  30 mg total) into the skin every 12 (twelve) hours. Continue Lovenox injections until the INR is therapeutic at or greater than 2.0.  When INR reaches the therapeutic level of equal to or greater than 2.0, the patient may discontinue the Lovenox injections.      ezetimibe 10 MG tablet  Commonly known as:  ZETIA  Take 10 mg by mouth every morning.     HYDROcodone-acetaminophen 5-325 MG per tablet  Commonly known as:  NORCO/VICODIN  Take 1-2 tablets by mouth every 4 (four) hours as needed for moderate pain.     levothyroxine 50 MCG tablet  Commonly known as:  SYNTHROID, LEVOTHROID  Take 50 mcg by mouth every morning.     losartan 100 MG tablet  Commonly known as:  COZAAR  Take 100 mg by mouth every morning.     methocarbamol 500 MG tablet  Commonly known as:  ROBAXIN  Take 1 tablet (500 mg total) by mouth every 6 (six) hours as needed for muscle spasms.     metoprolol succinate 50 MG 24 hr tablet  Commonly known as:  TOPROL-XL  Take 50 mg by mouth daily. Take with or immediately following a meal.     ondansetron 4 MG tablet  Commonly known as:  ZOFRAN  Take 1 tablet (4 mg total) by mouth every 6 (six) hours as needed for nausea.     polyethylene glycol packet  Commonly known as:  MIRALAX / GLYCOLAX  Take 17 g by mouth daily as needed for mild constipation.     traMADol 50 MG tablet  Commonly known as:  ULTRAM  Take 1-2 tablets (50-100 mg total) by mouth every 6 (six) hours as needed (mild pain).     warfarin 5 MG tablet  Commonly known as:  COUMADIN  Take 1 tablet (5 mg total) by mouth every morning. Take Coumadin for three weeks for postoperative protocol and then the patient may resume their previous Coumadin home regimen.  The dose may need to be adjusted based upon the INR.  Please follow the INR and titrate Coumadin dose for a therapeutic range between 2.0 and 3.0 INR.  After completing the three weeks of Coumadin, the patient may resume their previous Coumadin home regimen.       Follow-up Information    Follow up with [facility].   Why:  home health physical therapy   Contact information:   [address] [place] [address] [phone]       Follow up with [name], MD. Schedule an  appointment as soon as possible for a visit on 03/25/2014.   Specialty:  Orthopedic Surgery   Why:  Call office at [phone] to set up appointment with Dr. Denman George information:   [address] [place] [address] [place] [address] [phone]       Signed: Arlee Muslim, PA-C Orthopaedic Surgery 03/19/2014, 10:07 PM

## 2014-03-12 NOTE — Discharge Instructions (Addendum)
° °Dr. Frank Aluisio °Total Joint Specialist °[facility] °[address] °[place], [place] [address] °[phone] ° °TOTAL KNEE REPLACEMENT POSTOPERATIVE DIRECTIONS ° ° ° °Knee Rehabilitation, Guidelines Following Surgery  °Results after knee surgery are often greatly improved when you follow the exercise, range of motion and muscle strengthening exercises prescribed by your doctor. Safety measures are also important to protect the knee from further injury. Any time any of these exercises cause you to have increased pain or swelling in your knee joint, decrease the amount until you are comfortable again and slowly increase them. If you have problems or questions, call your caregiver or physical therapist for advice.  ° °HOME CARE INSTRUCTIONS  °Remove items at home which could result in a fall. This includes throw rugs or furniture in walking pathways.  °Continue medications as instructed at time of discharge. °You may have some home medications which will be placed on hold until you complete the course of blood thinner medication.  °You may start showering once you are discharged home but do not submerge the incision under water. Just pat the incision dry and apply a dry gauze dressing on daily. °Walk with walker as instructed.  °You may resume a sexual relationship in one month or when given the OK by  your doctor.  °· Use walker as long as suggested by your caregivers. °· Avoid periods of inactivity such as sitting longer than an hour when not asleep. This helps prevent blood clots.  °You may put full weight on your legs and walk as much as is comfortable.  °You may return to work once you are cleared by your doctor.  °Do not drive a car for 6 weeks or until released by you surgeon.  °· Do not drive while taking narcotics.  °Wear the elastic stockings for three weeks following surgery during the day but you may remove then at night. °Make sure you keep all of your appointments after your  operation with all of your doctors and caregivers. You should call the office at the above phone number and make an appointment for approximately two weeks after the date of your surgery. °Change the dressing daily and reapply a dry dressing each time. °Please pick up a stool softener and laxative for home use as long as you are requiring pain medications. °· ICE to the affected knee every three hours for 30 minutes at a time and then as needed for pain and swelling.  Continue to use ice on the knee for pain and swelling from surgery. You may notice swelling that will progress down to the foot and ankle.  This is normal after surgery.  Elevate the leg when you are not up walking on it.   °It is important for you to complete the blood thinner medication as prescribed by your doctor. °· Continue to use the breathing machine which will help keep your temperature down.  It is common for your temperature to cycle up and down following surgery, especially at night when you are not up moving around and exerting yourself.  The breathing machine keeps your lungs expanded and your temperature down. ° °RANGE OF MOTION AND STRENGTHENING EXERCISES  °Rehabilitation of the knee is important following a knee injury or an operation. After just a few days of immobilization, the muscles of the thigh which control the knee become weakened and shrink (atrophy). Knee exercises are designed to build up the tone and strength of the thigh muscles and to improve knee   motion. Often times heat used for twenty to thirty minutes before working out will loosen up your tissues and help with improving the range of motion but do not use heat for the first two weeks following surgery. These exercises can be done on a training (exercise) mat, on the floor, on a table or on a bed. Use what ever works the best and is most comfortable for you Knee exercises include:  Leg Lifts - While your knee is still immobilized in a splint or cast, you can do  straight leg raises. Lift the leg to 60 degrees, hold for 3 sec, and slowly lower the leg. Repeat 10-20 times 2-3 times daily. Perform this exercise against resistance later as your knee gets better.  Quad and Hamstring Sets - Tighten up the muscle on the front of the thigh (Quad) and hold for 5-10 sec. Repeat this 10-20 times hourly. Hamstring sets are done by pushing the foot backward against an object and holding for 5-10 sec. Repeat as with quad sets.  A rehabilitation program following serious knee injuries can speed recovery and prevent re-injury in the future due to weakened muscles. Contact your doctor or a physical therapist for more information on knee rehabilitation.   SKILLED REHAB INSTRUCTIONS: If the patient is transferred to a skilled rehab facility following release from the hospital, a list of the current medications will be sent to the facility for the patient to continue.  When discharged from the skilled rehab facility, please have the facility set up the patient's [place] prior to being released. Also, the skilled facility will be responsible for providing the patient with their medications at time of release from the facility to include their pain medication, the muscle relaxants, and their blood thinner medication. If the patient is still at the rehab facility at time of the two week follow up appointment, the skilled rehab facility will also need to assist the patient in arranging follow up appointment in our office and any transportation needs.  MAKE SURE YOU:  Understand these instructions.  Will watch your condition.  Will get help right away if you are not doing well or get worse.    Pick up stool softner and laxative for home use following surgery while on pain medications. Do not submerge incision under water. Please use good hand washing techniques while changing dressing each day. May shower starting three days after surgery. Please use a clean  towel to pat the incision dry following showers. Continue to use ice for pain and swelling after surgery. Do not use any lotions or creams on the incision until instructed by your surgeon.  Take Coumadin for three weeks for postoperative protocol and then the patient may resume their previous Coumadin home regimen.  The dose may need to be adjusted based upon the INR.  Please follow the INR and titrate Coumadin dose for a therapeutic range between 2.0 and 3.0 INR.  After completing the three weeks of Coumadin, the patient may resume their previous Coumadin home regimen.  Patient may resume his 325 mg Aspirin once he is therapeutic again on his Coumadin and he goes off the Lovenox.  Continue Lovenox injections until the INR is therapeutic at or greater than 2.0.  When INR reaches the therapeutic level of equal to or greater than 2.0, the patient may discontinue the Lovenox injections.  Postoperative Constipation Protocol  Constipation - defined medically as fewer than three stools per week and severe constipation as less than  one stool per week.  One of the most common issues patients have following surgery is constipation.  Even if you have a regular bowel pattern at home, your normal regimen is likely to be disrupted due to multiple reasons following surgery.  Combination of anesthesia, postoperative narcotics, change in appetite and fluid intake all can affect your bowels.  In order to avoid complications following surgery, here are some recommendations in order to help you during your recovery period.  Colace (docusate) - Pick up an over-the-counter form of Colace or another stool softener and take twice a day as long as you are requiring postoperative pain medications.  Take with a full glass of water daily.  If you experience loose stools or diarrhea, hold the colace until you stool forms back up.  If your symptoms do not get better within 1 week or if they get worse, check with your  doctor.  Dulcolax (bisacodyl) - Pick up over-the-counter and take as directed by the product packaging as needed to assist with the movement of your bowels.  Take with a full glass of water.  Use this product as needed if not relieved by Colace only.   MiraLax (polyethylene glycol) - Pick up over-the-counter to have on hand.  MiraLax is a solution that will increase the amount of water in your bowels to assist with bowel movements.  Take as directed and can mix with a glass of water, juice, soda, coffee, or tea.  Take if you go more than two days without a movement. Do not use MiraLax more than once per day. Call your doctor if you are still constipated or irregular after using this medication for 7 days in a row.  If you continue to have problems with postoperative constipation, please contact the office for further assistance and recommendations.  If you experience "the worst abdominal pain ever" or develop nausea or vomiting, please contact the office immediatly for further recommendations for treatment.

## 2014-03-12 NOTE — Progress Notes (Signed)
Physical Therapy Treatment Patient Details Name: [name] [name] MRN: [id number] DOB: 02-08-1940 Today's Date: 03/12/2014    History of Present Illness s/p L TKA    PT Comments    making great progress, declines stair practice, reporting he and wife are familiar with it from last surgery  Follow Up Recommendations  Home health PT     Equipment Recommendations  None recommended by PT    Recommendations for Other Services       Precautions / Restrictions Precautions Precautions: Knee Precaution Comments: pt declined to wear KI Required Braces or Orthoses: Knee Immobilizer - Left Knee Immobilizer - Left: Discontinue once straight leg raise with < 10 degree lag Restrictions Weight Bearing Restrictions: No Other Position/Activity Restrictions: WBAT    Mobility  Bed Mobility Overal bed mobility: Needs Assistance Bed Mobility: Supine to Sit     Supine to sit: Supervision     General bed mobility comments: cues for technique and increased time  Transfers Overall transfer level: Needs assistance Equipment used: Rolling walker (2 wheeled) Transfers: Sit to/from Stand Sit to Stand: Supervision         General transfer comment: cues for hand placement and control of descent  Ambulation/Gait Ambulation/Gait assistance: Supervision Ambulation Distance (Feet): 160 Feet Assistive device: Rolling walker (2 wheeled) Gait Pattern/deviations: Step-to pattern;Step-through pattern;Trunk flexed;Antalgic     General Gait Details:  cues for RW safety   Stairs            Wheelchair Mobility    Modified Rankin (Stroke Patients Only)       Balance                                    Cognition Arousal/Alertness: Awake/alert Behavior During Therapy: WFL for tasks assessed/performed Overall Cognitive Status: Within Functional Limits for tasks assessed                      Exercises Total Joint Exercises Ankle Circles/Pumps:  AROM;Both;10 reps Quad Sets: 10 reps;AROM;Both Heel Slides: AROM;10 reps;Left;AAROM Hip ABduction/ADduction: Left;Strengthening;AROM Straight Leg Raises: AROM;AAROM;Left;10 reps    General Comments        Pertinent Vitals/Pain Pain Assessment: 0-10 Pain Score: 4  Pain Location: L knee Pain Descriptors / Indicators: Sore Pain Intervention(s): Limited activity within patient's tolerance;Monitored during session;Ice applied    Home Living                      Prior Function            PT Goals (current goals can now be found in the care plan section) Acute Rehab PT Goals Patient Stated Goal: home soon PT Goal Formulation: With patient Time For Goal Achievement: 03/14/14 Potential to Achieve Goals: Good Progress towards PT goals: Progressing toward goals    Frequency  7X/week    PT Plan Current plan remains appropriate    Co-evaluation             End of Session Equipment Utilized During Treatment: Gait belt Activity Tolerance: Patient tolerated treatment well Patient left: in bed;with call bell/phone within reach;with family/visitor present     Time: 1572-6203 PT Time Calculation (min) (ACUTE ONLY): 27 min  Charges:  $Gait Training: 8-22 mins $Therapeutic Exercise: 8-22 mins                    G Codes:  [facility] 03/12/2014, 9:41 AM

## 2014-03-14 DIAGNOSIS — R269 Unspecified abnormalities of gait and mobility: Secondary | ICD-10-CM | POA: Diagnosis not present

## 2014-03-14 DIAGNOSIS — Z471 Aftercare following joint replacement surgery: Secondary | ICD-10-CM | POA: Diagnosis not present

## 2014-03-14 DIAGNOSIS — I10 Essential (primary) hypertension: Secondary | ICD-10-CM | POA: Diagnosis not present

## 2014-03-14 DIAGNOSIS — I739 Peripheral vascular disease, unspecified: Secondary | ICD-10-CM | POA: Diagnosis not present

## 2014-03-14 DIAGNOSIS — D6859 Other primary thrombophilia: Secondary | ICD-10-CM | POA: Diagnosis not present

## 2014-03-14 DIAGNOSIS — I2510 Atherosclerotic heart disease of native coronary artery without angina pectoris: Secondary | ICD-10-CM | POA: Diagnosis not present

## 2014-03-17 DIAGNOSIS — I10 Essential (primary) hypertension: Secondary | ICD-10-CM | POA: Diagnosis not present

## 2014-03-17 DIAGNOSIS — I739 Peripheral vascular disease, unspecified: Secondary | ICD-10-CM | POA: Diagnosis not present

## 2014-03-17 DIAGNOSIS — R269 Unspecified abnormalities of gait and mobility: Secondary | ICD-10-CM | POA: Diagnosis not present

## 2014-03-17 DIAGNOSIS — Z471 Aftercare following joint replacement surgery: Secondary | ICD-10-CM | POA: Diagnosis not present

## 2014-03-17 DIAGNOSIS — I2510 Atherosclerotic heart disease of native coronary artery without angina pectoris: Secondary | ICD-10-CM | POA: Diagnosis not present

## 2014-03-17 DIAGNOSIS — I8290 Acute embolism and thrombosis of unspecified vein: Secondary | ICD-10-CM | POA: Diagnosis not present

## 2014-03-17 DIAGNOSIS — D6859 Other primary thrombophilia: Secondary | ICD-10-CM | POA: Diagnosis not present

## 2014-03-18 DIAGNOSIS — I739 Peripheral vascular disease, unspecified: Secondary | ICD-10-CM | POA: Diagnosis not present

## 2014-03-18 DIAGNOSIS — I10 Essential (primary) hypertension: Secondary | ICD-10-CM | POA: Diagnosis not present

## 2014-03-18 DIAGNOSIS — Z471 Aftercare following joint replacement surgery: Secondary | ICD-10-CM | POA: Diagnosis not present

## 2014-03-18 DIAGNOSIS — I2510 Atherosclerotic heart disease of native coronary artery without angina pectoris: Secondary | ICD-10-CM | POA: Diagnosis not present

## 2014-03-18 DIAGNOSIS — R269 Unspecified abnormalities of gait and mobility: Secondary | ICD-10-CM | POA: Diagnosis not present

## 2014-03-18 DIAGNOSIS — D6859 Other primary thrombophilia: Secondary | ICD-10-CM | POA: Diagnosis not present

## 2014-03-20 DIAGNOSIS — I739 Peripheral vascular disease, unspecified: Secondary | ICD-10-CM | POA: Diagnosis not present

## 2014-03-20 DIAGNOSIS — I2510 Atherosclerotic heart disease of native coronary artery without angina pectoris: Secondary | ICD-10-CM | POA: Diagnosis not present

## 2014-03-20 DIAGNOSIS — D6859 Other primary thrombophilia: Secondary | ICD-10-CM | POA: Diagnosis not present

## 2014-03-20 DIAGNOSIS — Z471 Aftercare following joint replacement surgery: Secondary | ICD-10-CM | POA: Diagnosis not present

## 2014-03-20 DIAGNOSIS — I10 Essential (primary) hypertension: Secondary | ICD-10-CM | POA: Diagnosis not present

## 2014-03-20 DIAGNOSIS — R269 Unspecified abnormalities of gait and mobility: Secondary | ICD-10-CM | POA: Diagnosis not present

## 2014-03-24 DIAGNOSIS — D6859 Other primary thrombophilia: Secondary | ICD-10-CM | POA: Diagnosis not present

## 2014-03-24 DIAGNOSIS — R269 Unspecified abnormalities of gait and mobility: Secondary | ICD-10-CM | POA: Diagnosis not present

## 2014-03-24 DIAGNOSIS — I2510 Atherosclerotic heart disease of native coronary artery without angina pectoris: Secondary | ICD-10-CM | POA: Diagnosis not present

## 2014-03-24 DIAGNOSIS — I10 Essential (primary) hypertension: Secondary | ICD-10-CM | POA: Diagnosis not present

## 2014-03-24 DIAGNOSIS — Z471 Aftercare following joint replacement surgery: Secondary | ICD-10-CM | POA: Diagnosis not present

## 2014-03-24 DIAGNOSIS — I2782 Chronic pulmonary embolism: Secondary | ICD-10-CM | POA: Diagnosis not present

## 2014-03-24 DIAGNOSIS — I739 Peripheral vascular disease, unspecified: Secondary | ICD-10-CM | POA: Diagnosis not present

## 2014-03-25 DIAGNOSIS — Z96652 Presence of left artificial knee joint: Secondary | ICD-10-CM | POA: Diagnosis not present

## 2014-03-25 DIAGNOSIS — Z471 Aftercare following joint replacement surgery: Secondary | ICD-10-CM | POA: Diagnosis not present

## 2014-03-26 DIAGNOSIS — Z471 Aftercare following joint replacement surgery: Secondary | ICD-10-CM | POA: Diagnosis not present

## 2014-03-26 DIAGNOSIS — Z96652 Presence of left artificial knee joint: Secondary | ICD-10-CM | POA: Diagnosis not present

## 2014-03-26 DIAGNOSIS — R2689 Other abnormalities of gait and mobility: Secondary | ICD-10-CM | POA: Diagnosis not present

## 2014-03-28 DIAGNOSIS — R2689 Other abnormalities of gait and mobility: Secondary | ICD-10-CM | POA: Diagnosis not present

## 2014-03-28 DIAGNOSIS — Z96652 Presence of left artificial knee joint: Secondary | ICD-10-CM | POA: Diagnosis not present

## 2014-03-28 DIAGNOSIS — Z471 Aftercare following joint replacement surgery: Secondary | ICD-10-CM | POA: Diagnosis not present

## 2014-03-31 DIAGNOSIS — R2689 Other abnormalities of gait and mobility: Secondary | ICD-10-CM | POA: Diagnosis not present

## 2014-03-31 DIAGNOSIS — Z96652 Presence of left artificial knee joint: Secondary | ICD-10-CM | POA: Diagnosis not present

## 2014-03-31 DIAGNOSIS — Z471 Aftercare following joint replacement surgery: Secondary | ICD-10-CM | POA: Diagnosis not present

## 2014-04-01 DIAGNOSIS — Z96652 Presence of left artificial knee joint: Secondary | ICD-10-CM | POA: Diagnosis not present

## 2014-04-01 DIAGNOSIS — R2689 Other abnormalities of gait and mobility: Secondary | ICD-10-CM | POA: Diagnosis not present

## 2014-04-01 DIAGNOSIS — Z471 Aftercare following joint replacement surgery: Secondary | ICD-10-CM | POA: Diagnosis not present

## 2014-04-04 DIAGNOSIS — Z471 Aftercare following joint replacement surgery: Secondary | ICD-10-CM | POA: Diagnosis not present

## 2014-04-04 DIAGNOSIS — Z96652 Presence of left artificial knee joint: Secondary | ICD-10-CM | POA: Diagnosis not present

## 2014-04-04 DIAGNOSIS — R2689 Other abnormalities of gait and mobility: Secondary | ICD-10-CM | POA: Diagnosis not present

## 2014-04-07 DIAGNOSIS — Z471 Aftercare following joint replacement surgery: Secondary | ICD-10-CM | POA: Diagnosis not present

## 2014-04-07 DIAGNOSIS — Z96652 Presence of left artificial knee joint: Secondary | ICD-10-CM | POA: Diagnosis not present

## 2014-04-07 DIAGNOSIS — R2689 Other abnormalities of gait and mobility: Secondary | ICD-10-CM | POA: Diagnosis not present

## 2014-04-09 DIAGNOSIS — Z471 Aftercare following joint replacement surgery: Secondary | ICD-10-CM | POA: Diagnosis not present

## 2014-04-09 DIAGNOSIS — Z96652 Presence of left artificial knee joint: Secondary | ICD-10-CM | POA: Diagnosis not present

## 2014-04-09 DIAGNOSIS — R2689 Other abnormalities of gait and mobility: Secondary | ICD-10-CM | POA: Diagnosis not present

## 2014-04-11 DIAGNOSIS — Z471 Aftercare following joint replacement surgery: Secondary | ICD-10-CM | POA: Diagnosis not present

## 2014-04-11 DIAGNOSIS — Z96652 Presence of left artificial knee joint: Secondary | ICD-10-CM | POA: Diagnosis not present

## 2014-04-11 DIAGNOSIS — R2689 Other abnormalities of gait and mobility: Secondary | ICD-10-CM | POA: Diagnosis not present

## 2014-04-14 DIAGNOSIS — Z471 Aftercare following joint replacement surgery: Secondary | ICD-10-CM | POA: Diagnosis not present

## 2014-04-14 DIAGNOSIS — Z96652 Presence of left artificial knee joint: Secondary | ICD-10-CM | POA: Diagnosis not present

## 2014-04-14 DIAGNOSIS — R2689 Other abnormalities of gait and mobility: Secondary | ICD-10-CM | POA: Diagnosis not present

## 2014-04-15 DIAGNOSIS — Z471 Aftercare following joint replacement surgery: Secondary | ICD-10-CM | POA: Diagnosis not present

## 2014-04-15 DIAGNOSIS — Z96652 Presence of left artificial knee joint: Secondary | ICD-10-CM | POA: Diagnosis not present

## 2014-04-16 DIAGNOSIS — R2689 Other abnormalities of gait and mobility: Secondary | ICD-10-CM | POA: Diagnosis not present

## 2014-04-16 DIAGNOSIS — Z471 Aftercare following joint replacement surgery: Secondary | ICD-10-CM | POA: Diagnosis not present

## 2014-04-16 DIAGNOSIS — Z96652 Presence of left artificial knee joint: Secondary | ICD-10-CM | POA: Diagnosis not present

## 2014-04-18 DIAGNOSIS — R2689 Other abnormalities of gait and mobility: Secondary | ICD-10-CM | POA: Diagnosis not present

## 2014-04-18 DIAGNOSIS — Z471 Aftercare following joint replacement surgery: Secondary | ICD-10-CM | POA: Diagnosis not present

## 2014-04-18 DIAGNOSIS — Z96652 Presence of left artificial knee joint: Secondary | ICD-10-CM | POA: Diagnosis not present

## 2014-04-21 DIAGNOSIS — R2689 Other abnormalities of gait and mobility: Secondary | ICD-10-CM | POA: Diagnosis not present

## 2014-04-21 DIAGNOSIS — Z471 Aftercare following joint replacement surgery: Secondary | ICD-10-CM | POA: Diagnosis not present

## 2014-04-21 DIAGNOSIS — Z96652 Presence of left artificial knee joint: Secondary | ICD-10-CM | POA: Diagnosis not present

## 2014-04-25 DIAGNOSIS — I8290 Acute embolism and thrombosis of unspecified vein: Secondary | ICD-10-CM | POA: Diagnosis not present

## 2014-04-28 DIAGNOSIS — J209 Acute bronchitis, unspecified: Secondary | ICD-10-CM | POA: Diagnosis not present

## 2014-04-28 DIAGNOSIS — R05 Cough: Secondary | ICD-10-CM | POA: Diagnosis not present

## 2014-05-05 DIAGNOSIS — J209 Acute bronchitis, unspecified: Secondary | ICD-10-CM | POA: Diagnosis not present

## 2014-05-05 DIAGNOSIS — R05 Cough: Secondary | ICD-10-CM | POA: Diagnosis not present

## 2014-05-05 DIAGNOSIS — R0602 Shortness of breath: Secondary | ICD-10-CM | POA: Diagnosis not present

## 2014-06-06 DIAGNOSIS — I8290 Acute embolism and thrombosis of unspecified vein: Secondary | ICD-10-CM | POA: Diagnosis not present

## 2014-06-10 DIAGNOSIS — Z471 Aftercare following joint replacement surgery: Secondary | ICD-10-CM | POA: Diagnosis not present

## 2014-06-10 DIAGNOSIS — Z96652 Presence of left artificial knee joint: Secondary | ICD-10-CM | POA: Diagnosis not present

## 2014-06-17 NOTE — Patient Outreach (Signed)
[facility]) Care Management  06/17/2014  [name] [name] [birth year] [id number]   Referral with MD notes received from MD office, [name] Martins, RN assigned to outreach via telephone.  Ronnell Freshwater. [place], [place] Management [place] Assistant Phone: [phone] Fax: [phone]

## 2014-06-18 ENCOUNTER — Other Ambulatory Visit: Payer: Self-pay | Admitting: *Deleted

## 2014-06-18 NOTE — Patient Outreach (Signed)
[facility]) Care Management  06/18/2014  [name] [name] 1940/06/08 [id number]  Received MD referral for [facility] services.  Telephone outreach screening call to patient discussed and offered [facility] Management services, patient declined, patient politely declined to be mailed written information or the need of further time to think over [facility] services.   Subjective: " We are all set"  Joylene Draft, RN, [facility] Management [phone]

## 2014-06-20 DIAGNOSIS — H2511 Age-related nuclear cataract, right eye: Secondary | ICD-10-CM | POA: Diagnosis not present

## 2014-06-20 DIAGNOSIS — H25011 Cortical age-related cataract, right eye: Secondary | ICD-10-CM | POA: Diagnosis not present

## 2014-06-24 NOTE — Patient Outreach (Signed)
[facility]) Care Management  06/24/2014  Naftali Carchi July 28, 1940 [id number]   Notification from Landis Martins, RN to close case as patient declined [facility] Management services.  Ronnell Freshwater. [place], [place] Management [place] Assistant Phone: [phone] Fax: [phone]

## 2014-06-25 ENCOUNTER — Encounter: Payer: Self-pay | Admitting: *Deleted

## 2014-06-30 DIAGNOSIS — H25011 Cortical age-related cataract, right eye: Secondary | ICD-10-CM | POA: Diagnosis not present

## 2014-06-30 DIAGNOSIS — H2511 Age-related nuclear cataract, right eye: Secondary | ICD-10-CM | POA: Diagnosis not present

## 2014-07-01 DIAGNOSIS — H2511 Age-related nuclear cataract, right eye: Secondary | ICD-10-CM | POA: Diagnosis not present

## 2014-07-10 DIAGNOSIS — I8290 Acute embolism and thrombosis of unspecified vein: Secondary | ICD-10-CM | POA: Diagnosis not present

## 2014-07-11 DIAGNOSIS — E039 Hypothyroidism, unspecified: Secondary | ICD-10-CM | POA: Diagnosis not present

## 2014-07-11 DIAGNOSIS — E119 Type 2 diabetes mellitus without complications: Secondary | ICD-10-CM | POA: Diagnosis not present

## 2014-07-11 DIAGNOSIS — I10 Essential (primary) hypertension: Secondary | ICD-10-CM | POA: Diagnosis not present

## 2014-07-11 DIAGNOSIS — E6609 Other obesity due to excess calories: Secondary | ICD-10-CM | POA: Diagnosis not present

## 2014-07-11 DIAGNOSIS — E8881 Metabolic syndrome: Secondary | ICD-10-CM | POA: Diagnosis not present

## 2014-07-11 DIAGNOSIS — I2782 Chronic pulmonary embolism: Secondary | ICD-10-CM | POA: Diagnosis not present

## 2014-07-11 DIAGNOSIS — E782 Mixed hyperlipidemia: Secondary | ICD-10-CM | POA: Diagnosis not present

## 2014-07-15 DIAGNOSIS — D225 Melanocytic nevi of trunk: Secondary | ICD-10-CM | POA: Diagnosis not present

## 2014-07-15 DIAGNOSIS — D485 Neoplasm of uncertain behavior of skin: Secondary | ICD-10-CM | POA: Diagnosis not present

## 2014-07-15 DIAGNOSIS — L821 Other seborrheic keratosis: Secondary | ICD-10-CM | POA: Diagnosis not present

## 2014-07-15 DIAGNOSIS — L570 Actinic keratosis: Secondary | ICD-10-CM | POA: Diagnosis not present

## 2014-07-18 DIAGNOSIS — I2510 Atherosclerotic heart disease of native coronary artery without angina pectoris: Secondary | ICD-10-CM | POA: Diagnosis not present

## 2014-07-18 DIAGNOSIS — I10 Essential (primary) hypertension: Secondary | ICD-10-CM | POA: Diagnosis not present

## 2014-07-18 DIAGNOSIS — E039 Hypothyroidism, unspecified: Secondary | ICD-10-CM | POA: Diagnosis not present

## 2014-07-18 DIAGNOSIS — E6609 Other obesity due to excess calories: Secondary | ICD-10-CM | POA: Diagnosis not present

## 2014-07-18 DIAGNOSIS — E782 Mixed hyperlipidemia: Secondary | ICD-10-CM | POA: Diagnosis not present

## 2014-07-18 DIAGNOSIS — G6289 Other specified polyneuropathies: Secondary | ICD-10-CM | POA: Diagnosis not present

## 2014-07-18 DIAGNOSIS — I2782 Chronic pulmonary embolism: Secondary | ICD-10-CM | POA: Diagnosis not present

## 2014-07-18 DIAGNOSIS — E8881 Metabolic syndrome: Secondary | ICD-10-CM | POA: Diagnosis not present

## 2014-07-18 DIAGNOSIS — E1142 Type 2 diabetes mellitus with diabetic polyneuropathy: Secondary | ICD-10-CM | POA: Diagnosis not present

## 2014-07-18 DIAGNOSIS — J301 Allergic rhinitis due to pollen: Secondary | ICD-10-CM | POA: Diagnosis not present

## 2014-07-23 DIAGNOSIS — H25012 Cortical age-related cataract, left eye: Secondary | ICD-10-CM | POA: Diagnosis not present

## 2014-07-23 DIAGNOSIS — H2512 Age-related nuclear cataract, left eye: Secondary | ICD-10-CM | POA: Diagnosis not present

## 2014-07-25 DIAGNOSIS — T1502XA Foreign body in cornea, left eye, initial encounter: Secondary | ICD-10-CM | POA: Diagnosis not present

## 2014-07-29 DIAGNOSIS — H25012 Cortical age-related cataract, left eye: Secondary | ICD-10-CM | POA: Diagnosis not present

## 2014-07-29 DIAGNOSIS — H2512 Age-related nuclear cataract, left eye: Secondary | ICD-10-CM | POA: Diagnosis not present

## 2014-08-14 DIAGNOSIS — I2782 Chronic pulmonary embolism: Secondary | ICD-10-CM | POA: Diagnosis not present

## 2014-09-23 DIAGNOSIS — I8290 Acute embolism and thrombosis of unspecified vein: Secondary | ICD-10-CM | POA: Diagnosis not present

## 2014-10-23 DIAGNOSIS — I2782 Chronic pulmonary embolism: Secondary | ICD-10-CM | POA: Diagnosis not present

## 2014-11-05 DIAGNOSIS — Z23 Encounter for immunization: Secondary | ICD-10-CM | POA: Diagnosis not present

## 2014-12-04 DIAGNOSIS — I8290 Acute embolism and thrombosis of unspecified vein: Secondary | ICD-10-CM | POA: Diagnosis not present

## 2015-01-08 DIAGNOSIS — I8290 Acute embolism and thrombosis of unspecified vein: Secondary | ICD-10-CM | POA: Diagnosis not present

## 2015-01-28 DIAGNOSIS — E1142 Type 2 diabetes mellitus with diabetic polyneuropathy: Secondary | ICD-10-CM | POA: Diagnosis not present

## 2015-01-28 DIAGNOSIS — E782 Mixed hyperlipidemia: Secondary | ICD-10-CM | POA: Diagnosis not present

## 2015-01-28 DIAGNOSIS — I10 Essential (primary) hypertension: Secondary | ICD-10-CM | POA: Diagnosis not present

## 2015-01-28 DIAGNOSIS — E039 Hypothyroidism, unspecified: Secondary | ICD-10-CM | POA: Diagnosis not present

## 2015-01-28 DIAGNOSIS — E8881 Metabolic syndrome: Secondary | ICD-10-CM | POA: Diagnosis not present

## 2015-02-04 DIAGNOSIS — G6289 Other specified polyneuropathies: Secondary | ICD-10-CM | POA: Diagnosis not present

## 2015-02-04 DIAGNOSIS — I2510 Atherosclerotic heart disease of native coronary artery without angina pectoris: Secondary | ICD-10-CM | POA: Diagnosis not present

## 2015-02-04 DIAGNOSIS — E6609 Other obesity due to excess calories: Secondary | ICD-10-CM | POA: Diagnosis not present

## 2015-02-04 DIAGNOSIS — Z0001 Encounter for general adult medical examination with abnormal findings: Secondary | ICD-10-CM | POA: Diagnosis not present

## 2015-02-04 DIAGNOSIS — E1142 Type 2 diabetes mellitus with diabetic polyneuropathy: Secondary | ICD-10-CM | POA: Diagnosis not present

## 2015-02-04 DIAGNOSIS — I10 Essential (primary) hypertension: Secondary | ICD-10-CM | POA: Diagnosis not present

## 2015-02-04 DIAGNOSIS — E8881 Metabolic syndrome: Secondary | ICD-10-CM | POA: Diagnosis not present

## 2015-02-04 DIAGNOSIS — E782 Mixed hyperlipidemia: Secondary | ICD-10-CM | POA: Diagnosis not present

## 2015-02-04 DIAGNOSIS — I2782 Chronic pulmonary embolism: Secondary | ICD-10-CM | POA: Diagnosis not present

## 2015-02-04 DIAGNOSIS — E039 Hypothyroidism, unspecified: Secondary | ICD-10-CM | POA: Diagnosis not present

## 2015-02-18 DIAGNOSIS — I8290 Acute embolism and thrombosis of unspecified vein: Secondary | ICD-10-CM | POA: Diagnosis not present

## 2015-03-17 DIAGNOSIS — E039 Hypothyroidism, unspecified: Secondary | ICD-10-CM | POA: Diagnosis not present

## 2015-03-17 DIAGNOSIS — R05 Cough: Secondary | ICD-10-CM | POA: Diagnosis not present

## 2015-03-17 DIAGNOSIS — I482 Chronic atrial fibrillation: Secondary | ICD-10-CM | POA: Diagnosis not present

## 2015-03-24 DIAGNOSIS — I482 Chronic atrial fibrillation: Secondary | ICD-10-CM | POA: Diagnosis not present

## 2015-04-09 DIAGNOSIS — Z1211 Encounter for screening for malignant neoplasm of colon: Secondary | ICD-10-CM | POA: Diagnosis not present

## 2015-04-24 DIAGNOSIS — I2782 Chronic pulmonary embolism: Secondary | ICD-10-CM | POA: Diagnosis not present

## 2015-04-24 DIAGNOSIS — I482 Chronic atrial fibrillation: Secondary | ICD-10-CM | POA: Diagnosis not present

## 2015-06-05 DIAGNOSIS — I482 Chronic atrial fibrillation: Secondary | ICD-10-CM | POA: Diagnosis not present

## 2015-06-29 DIAGNOSIS — I2510 Atherosclerotic heart disease of native coronary artery without angina pectoris: Secondary | ICD-10-CM | POA: Diagnosis not present

## 2015-06-29 DIAGNOSIS — E782 Mixed hyperlipidemia: Secondary | ICD-10-CM | POA: Diagnosis not present

## 2015-06-29 DIAGNOSIS — E1142 Type 2 diabetes mellitus with diabetic polyneuropathy: Secondary | ICD-10-CM | POA: Diagnosis not present

## 2015-06-29 DIAGNOSIS — I10 Essential (primary) hypertension: Secondary | ICD-10-CM | POA: Diagnosis not present

## 2015-06-29 DIAGNOSIS — E8881 Metabolic syndrome: Secondary | ICD-10-CM | POA: Diagnosis not present

## 2015-06-29 DIAGNOSIS — I482 Chronic atrial fibrillation: Secondary | ICD-10-CM | POA: Diagnosis not present

## 2015-07-02 DIAGNOSIS — G6289 Other specified polyneuropathies: Secondary | ICD-10-CM | POA: Diagnosis not present

## 2015-07-02 DIAGNOSIS — E1142 Type 2 diabetes mellitus with diabetic polyneuropathy: Secondary | ICD-10-CM | POA: Diagnosis not present

## 2015-07-02 DIAGNOSIS — I10 Essential (primary) hypertension: Secondary | ICD-10-CM | POA: Diagnosis not present

## 2015-07-02 DIAGNOSIS — I2782 Chronic pulmonary embolism: Secondary | ICD-10-CM | POA: Diagnosis not present

## 2015-07-02 DIAGNOSIS — E039 Hypothyroidism, unspecified: Secondary | ICD-10-CM | POA: Diagnosis not present

## 2015-07-02 DIAGNOSIS — E782 Mixed hyperlipidemia: Secondary | ICD-10-CM | POA: Diagnosis not present

## 2015-07-02 DIAGNOSIS — E8881 Metabolic syndrome: Secondary | ICD-10-CM | POA: Diagnosis not present

## 2015-07-02 DIAGNOSIS — E6609 Other obesity due to excess calories: Secondary | ICD-10-CM | POA: Diagnosis not present

## 2015-07-06 DIAGNOSIS — Z961 Presence of intraocular lens: Secondary | ICD-10-CM | POA: Diagnosis not present

## 2015-07-06 DIAGNOSIS — H524 Presbyopia: Secondary | ICD-10-CM | POA: Diagnosis not present

## 2015-07-10 DIAGNOSIS — I482 Chronic atrial fibrillation: Secondary | ICD-10-CM | POA: Diagnosis not present

## 2015-07-10 DIAGNOSIS — I2782 Chronic pulmonary embolism: Secondary | ICD-10-CM | POA: Diagnosis not present

## 2015-07-14 DIAGNOSIS — L821 Other seborrheic keratosis: Secondary | ICD-10-CM | POA: Diagnosis not present

## 2015-07-14 DIAGNOSIS — L570 Actinic keratosis: Secondary | ICD-10-CM | POA: Diagnosis not present

## 2015-08-14 DIAGNOSIS — I482 Chronic atrial fibrillation: Secondary | ICD-10-CM | POA: Diagnosis not present

## 2015-09-18 ENCOUNTER — Other Ambulatory Visit: Payer: Self-pay

## 2015-09-18 DIAGNOSIS — I2782 Chronic pulmonary embolism: Secondary | ICD-10-CM | POA: Diagnosis not present

## 2015-10-05 DIAGNOSIS — Z23 Encounter for immunization: Secondary | ICD-10-CM | POA: Diagnosis not present

## 2015-10-30 DIAGNOSIS — I482 Chronic atrial fibrillation: Secondary | ICD-10-CM | POA: Diagnosis not present

## 2015-10-30 DIAGNOSIS — I2782 Chronic pulmonary embolism: Secondary | ICD-10-CM | POA: Diagnosis not present

## 2015-11-30 DIAGNOSIS — E875 Hyperkalemia: Secondary | ICD-10-CM | POA: Diagnosis not present

## 2015-11-30 DIAGNOSIS — I482 Chronic atrial fibrillation: Secondary | ICD-10-CM | POA: Diagnosis not present

## 2015-11-30 DIAGNOSIS — E8881 Metabolic syndrome: Secondary | ICD-10-CM | POA: Diagnosis not present

## 2015-11-30 DIAGNOSIS — E039 Hypothyroidism, unspecified: Secondary | ICD-10-CM | POA: Diagnosis not present

## 2015-11-30 DIAGNOSIS — E1142 Type 2 diabetes mellitus with diabetic polyneuropathy: Secondary | ICD-10-CM | POA: Diagnosis not present

## 2015-11-30 DIAGNOSIS — I2782 Chronic pulmonary embolism: Secondary | ICD-10-CM | POA: Diagnosis not present

## 2015-11-30 DIAGNOSIS — E782 Mixed hyperlipidemia: Secondary | ICD-10-CM | POA: Diagnosis not present

## 2015-11-30 DIAGNOSIS — I10 Essential (primary) hypertension: Secondary | ICD-10-CM | POA: Diagnosis not present

## 2015-11-30 DIAGNOSIS — I8290 Acute embolism and thrombosis of unspecified vein: Secondary | ICD-10-CM | POA: Diagnosis not present

## 2015-12-03 DIAGNOSIS — Z6837 Body mass index (BMI) 37.0-37.9, adult: Secondary | ICD-10-CM | POA: Diagnosis not present

## 2015-12-03 DIAGNOSIS — E1142 Type 2 diabetes mellitus with diabetic polyneuropathy: Secondary | ICD-10-CM | POA: Diagnosis not present

## 2015-12-03 DIAGNOSIS — E6609 Other obesity due to excess calories: Secondary | ICD-10-CM | POA: Diagnosis not present

## 2015-12-03 DIAGNOSIS — I10 Essential (primary) hypertension: Secondary | ICD-10-CM | POA: Diagnosis not present

## 2015-12-03 DIAGNOSIS — E039 Hypothyroidism, unspecified: Secondary | ICD-10-CM | POA: Diagnosis not present

## 2015-12-03 DIAGNOSIS — E8881 Metabolic syndrome: Secondary | ICD-10-CM | POA: Diagnosis not present

## 2015-12-03 DIAGNOSIS — E782 Mixed hyperlipidemia: Secondary | ICD-10-CM | POA: Diagnosis not present

## 2015-12-03 DIAGNOSIS — G6289 Other specified polyneuropathies: Secondary | ICD-10-CM | POA: Diagnosis not present

## 2015-12-29 IMAGING — CR DG CHEST 2V
2 series · 2 of 2 positions shown · non-contrast
Comparison: April 26, 2009

CLINICAL DATA: Preoperative knee replacement ; hypertension

EXAM:
CHEST  2 VIEW

[w chest pa]
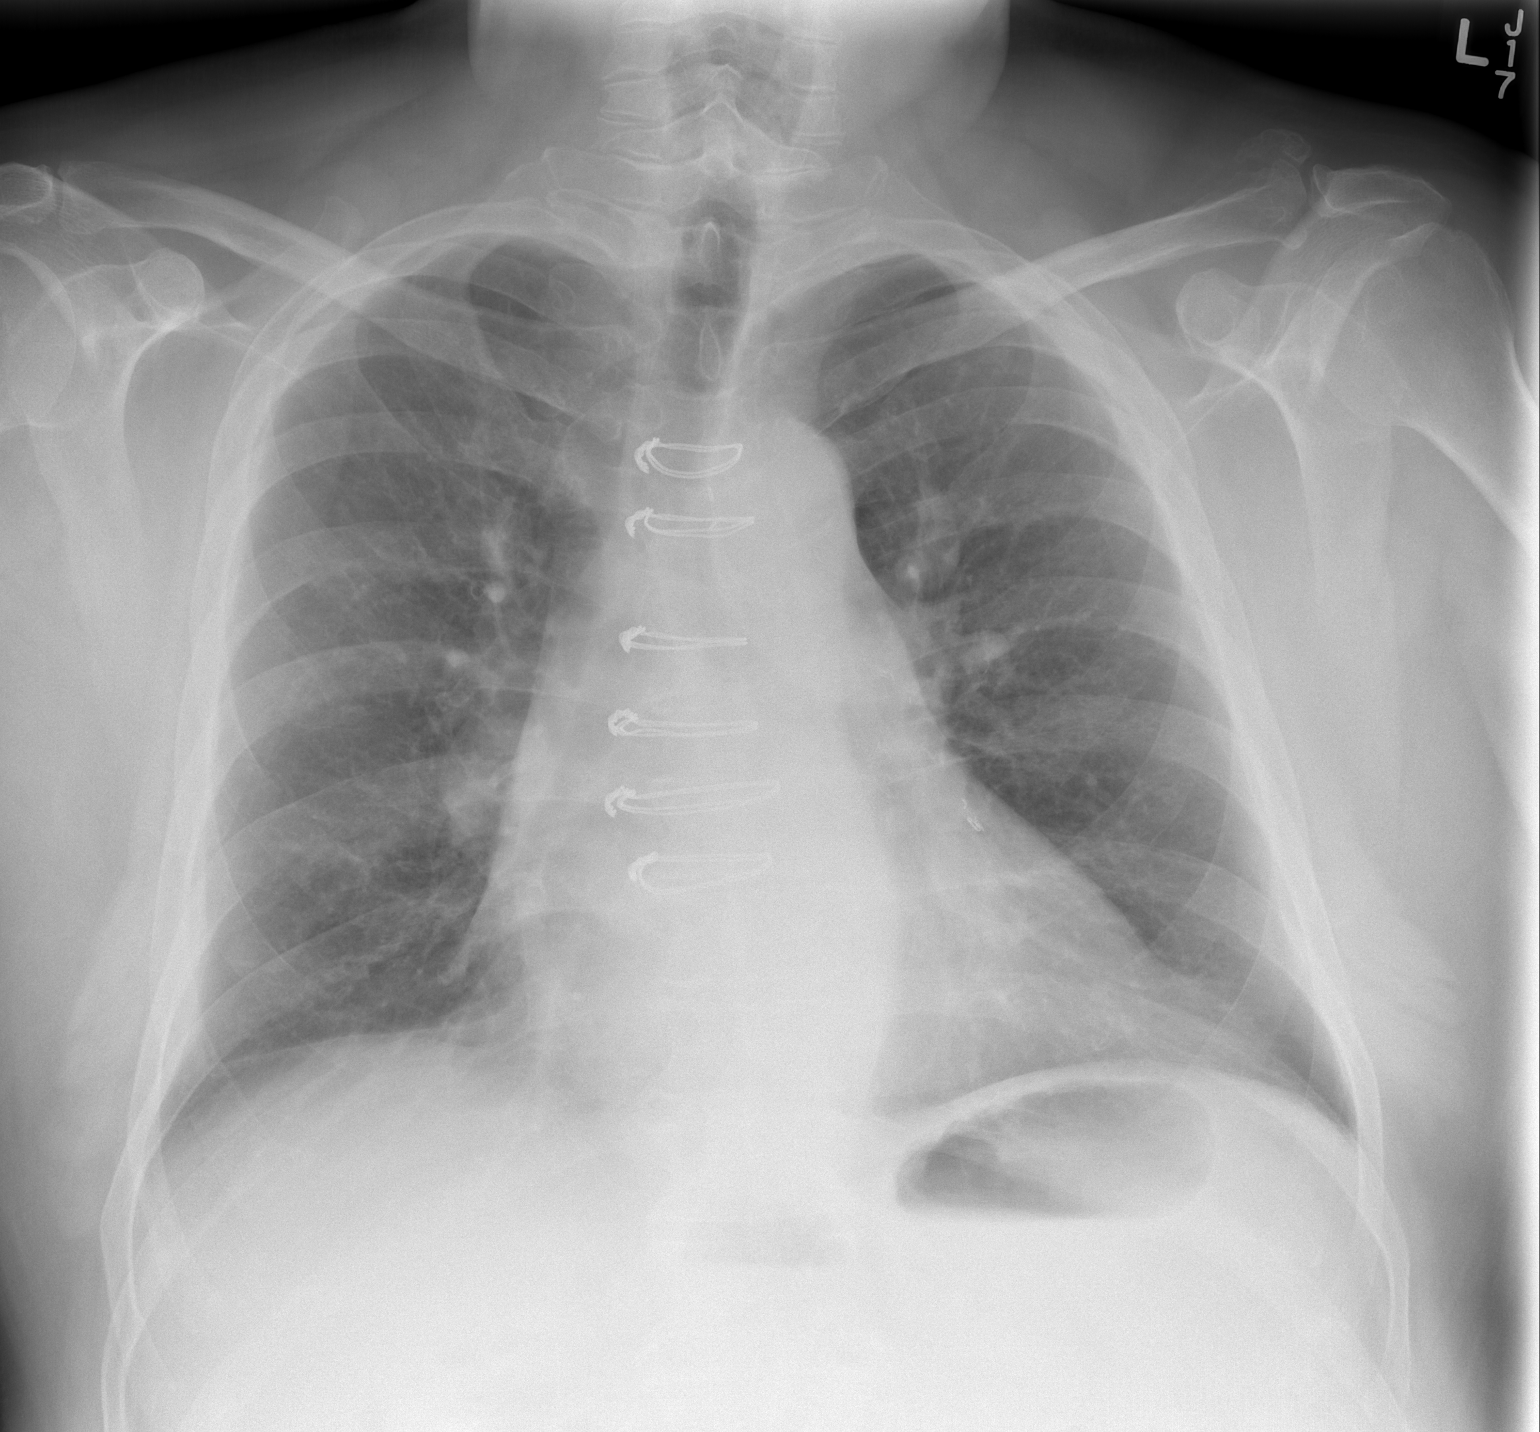

[w chest lat]
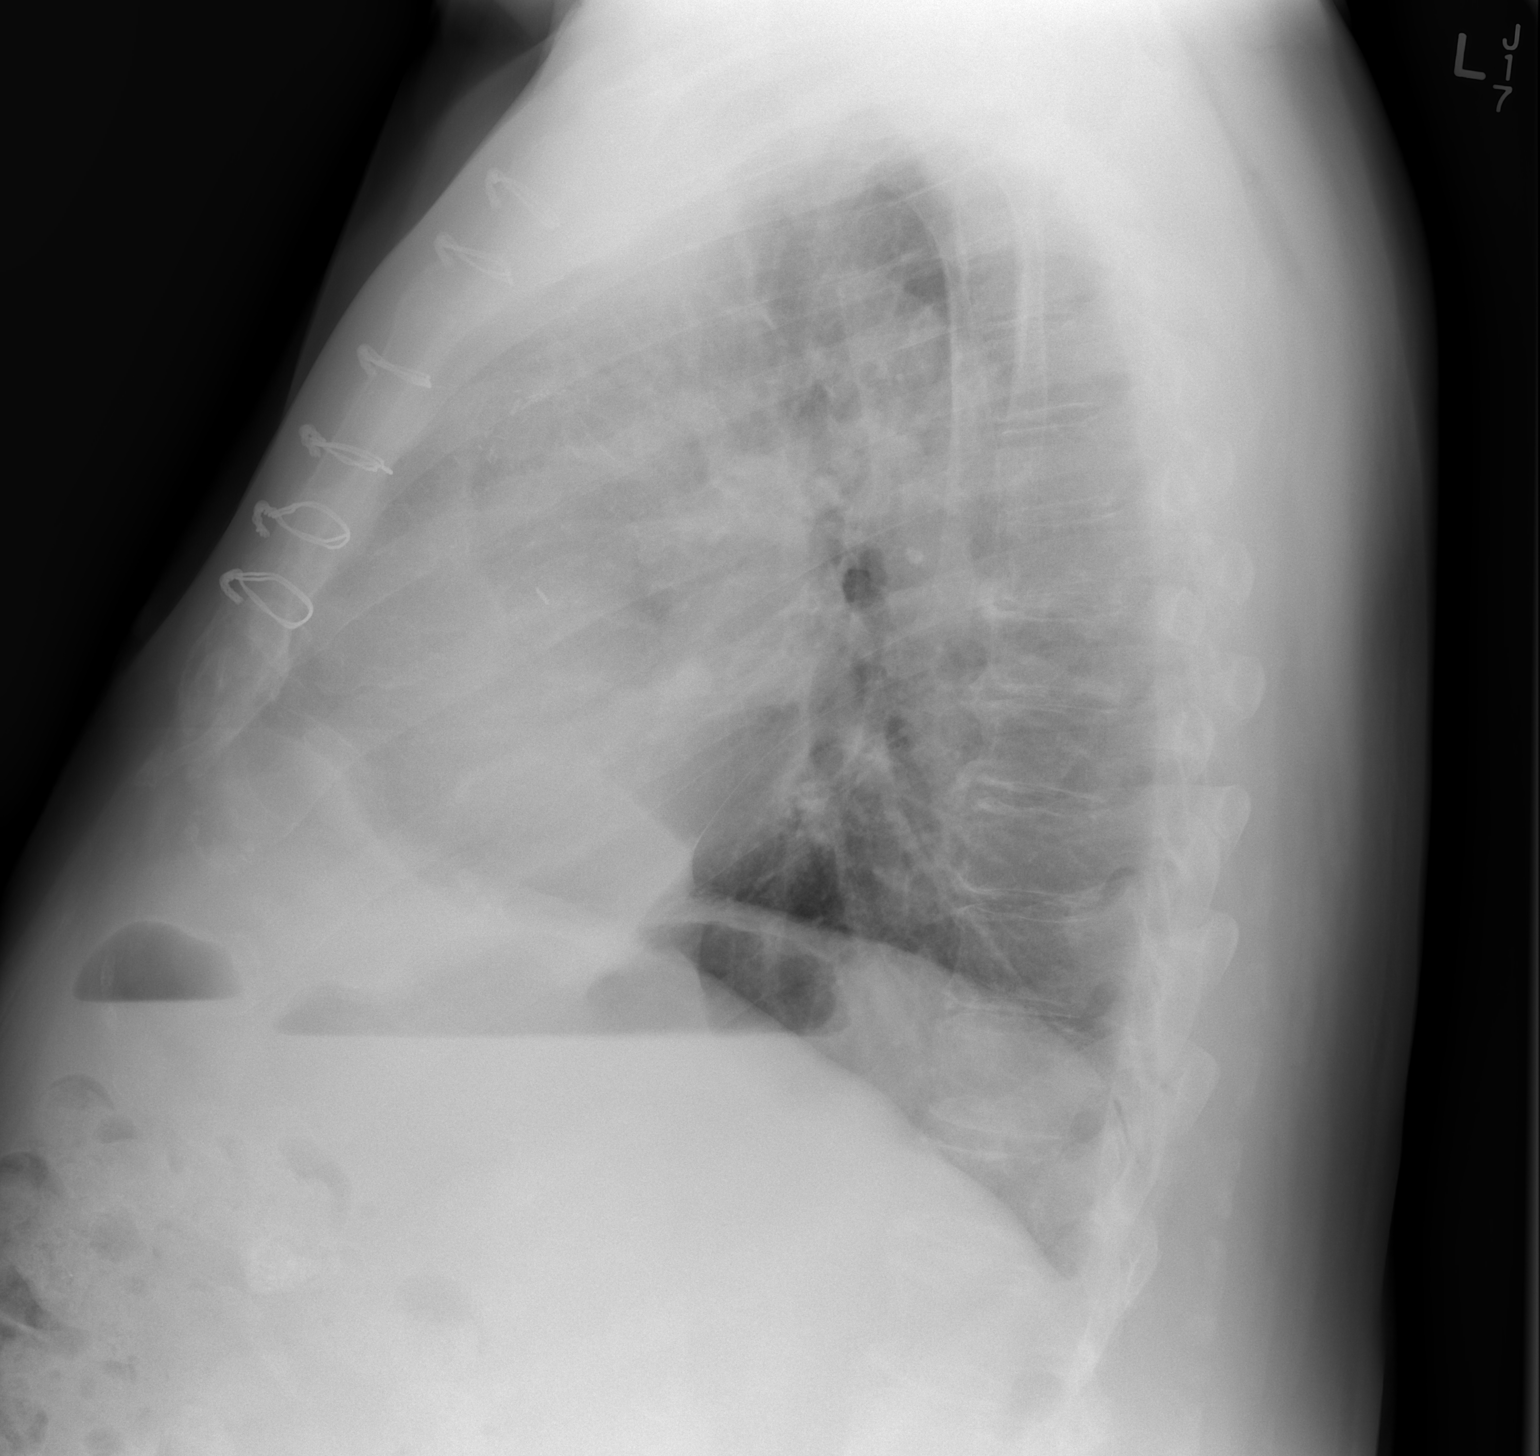

[2 of 2 positions shown; findings below may reference images not displayed]

FINDINGS: There is no edema or consolidation. Heart is upper normal in size
with pulmonary vascularity within normal limits. No adenopathy.
Patient is status post internal mammary bypass grafting. There is
degenerative change in the thoracic spine.
IMPRESSION: No edema or consolidation.

## 2016-01-12 DIAGNOSIS — I482 Chronic atrial fibrillation: Secondary | ICD-10-CM | POA: Diagnosis not present

## 2016-01-12 DIAGNOSIS — I8290 Acute embolism and thrombosis of unspecified vein: Secondary | ICD-10-CM | POA: Diagnosis not present

## 2016-01-12 DIAGNOSIS — I2782 Chronic pulmonary embolism: Secondary | ICD-10-CM | POA: Diagnosis not present

## 2016-02-16 DIAGNOSIS — I482 Chronic atrial fibrillation: Secondary | ICD-10-CM | POA: Diagnosis not present

## 2016-02-16 DIAGNOSIS — I8290 Acute embolism and thrombosis of unspecified vein: Secondary | ICD-10-CM | POA: Diagnosis not present

## 2016-03-05 DIAGNOSIS — Z6838 Body mass index (BMI) 38.0-38.9, adult: Secondary | ICD-10-CM | POA: Diagnosis not present

## 2016-03-05 DIAGNOSIS — N410 Acute prostatitis: Secondary | ICD-10-CM | POA: Diagnosis not present

## 2016-03-05 DIAGNOSIS — J209 Acute bronchitis, unspecified: Secondary | ICD-10-CM | POA: Diagnosis not present

## 2016-03-08 DIAGNOSIS — J4541 Moderate persistent asthma with (acute) exacerbation: Secondary | ICD-10-CM | POA: Diagnosis not present

## 2016-03-08 DIAGNOSIS — R0902 Hypoxemia: Secondary | ICD-10-CM | POA: Diagnosis not present

## 2016-03-08 DIAGNOSIS — D6859 Other primary thrombophilia: Secondary | ICD-10-CM | POA: Diagnosis not present

## 2016-03-08 DIAGNOSIS — J440 Chronic obstructive pulmonary disease with acute lower respiratory infection: Secondary | ICD-10-CM | POA: Diagnosis not present

## 2016-03-08 DIAGNOSIS — J45901 Unspecified asthma with (acute) exacerbation: Secondary | ICD-10-CM | POA: Diagnosis not present

## 2016-03-08 DIAGNOSIS — Z6837 Body mass index (BMI) 37.0-37.9, adult: Secondary | ICD-10-CM | POA: Diagnosis not present

## 2016-03-08 DIAGNOSIS — I2699 Other pulmonary embolism without acute cor pulmonale: Secondary | ICD-10-CM | POA: Diagnosis not present

## 2016-03-08 DIAGNOSIS — J209 Acute bronchitis, unspecified: Secondary | ICD-10-CM | POA: Diagnosis not present

## 2016-03-08 DIAGNOSIS — R0602 Shortness of breath: Secondary | ICD-10-CM | POA: Diagnosis not present

## 2016-03-09 DIAGNOSIS — J440 Chronic obstructive pulmonary disease with acute lower respiratory infection: Secondary | ICD-10-CM | POA: Diagnosis not present

## 2016-03-09 DIAGNOSIS — Z951 Presence of aortocoronary bypass graft: Secondary | ICD-10-CM | POA: Diagnosis not present

## 2016-03-09 DIAGNOSIS — I2699 Other pulmonary embolism without acute cor pulmonale: Secondary | ICD-10-CM | POA: Diagnosis present

## 2016-03-09 DIAGNOSIS — N183 Chronic kidney disease, stage 3 (moderate): Secondary | ICD-10-CM | POA: Diagnosis present

## 2016-03-09 DIAGNOSIS — Z7901 Long term (current) use of anticoagulants: Secondary | ICD-10-CM | POA: Diagnosis not present

## 2016-03-09 DIAGNOSIS — I129 Hypertensive chronic kidney disease with stage 1 through stage 4 chronic kidney disease, or unspecified chronic kidney disease: Secondary | ICD-10-CM | POA: Diagnosis present

## 2016-03-09 DIAGNOSIS — R0902 Hypoxemia: Secondary | ICD-10-CM | POA: Diagnosis not present

## 2016-03-09 DIAGNOSIS — R0602 Shortness of breath: Secondary | ICD-10-CM | POA: Diagnosis not present

## 2016-03-09 DIAGNOSIS — Z6837 Body mass index (BMI) 37.0-37.9, adult: Secondary | ICD-10-CM | POA: Diagnosis not present

## 2016-03-09 DIAGNOSIS — E785 Hyperlipidemia, unspecified: Secondary | ICD-10-CM | POA: Diagnosis present

## 2016-03-09 DIAGNOSIS — D696 Thrombocytopenia, unspecified: Secondary | ICD-10-CM | POA: Diagnosis present

## 2016-03-09 DIAGNOSIS — Z79899 Other long term (current) drug therapy: Secondary | ICD-10-CM | POA: Diagnosis not present

## 2016-03-09 DIAGNOSIS — J9601 Acute respiratory failure with hypoxia: Secondary | ICD-10-CM | POA: Diagnosis not present

## 2016-03-09 DIAGNOSIS — I2510 Atherosclerotic heart disease of native coronary artery without angina pectoris: Secondary | ICD-10-CM | POA: Diagnosis present

## 2016-03-09 DIAGNOSIS — D6859 Other primary thrombophilia: Secondary | ICD-10-CM | POA: Diagnosis present

## 2016-03-09 DIAGNOSIS — J209 Acute bronchitis, unspecified: Secondary | ICD-10-CM | POA: Diagnosis present

## 2016-03-09 DIAGNOSIS — J45901 Unspecified asthma with (acute) exacerbation: Secondary | ICD-10-CM | POA: Diagnosis present

## 2016-03-25 DIAGNOSIS — I482 Chronic atrial fibrillation: Secondary | ICD-10-CM | POA: Diagnosis not present

## 2016-03-25 DIAGNOSIS — Z9189 Other specified personal risk factors, not elsewhere classified: Secondary | ICD-10-CM | POA: Diagnosis not present

## 2016-03-25 DIAGNOSIS — I2782 Chronic pulmonary embolism: Secondary | ICD-10-CM | POA: Diagnosis not present

## 2016-03-25 DIAGNOSIS — E039 Hypothyroidism, unspecified: Secondary | ICD-10-CM | POA: Diagnosis not present

## 2016-03-25 DIAGNOSIS — E1142 Type 2 diabetes mellitus with diabetic polyneuropathy: Secondary | ICD-10-CM | POA: Diagnosis not present

## 2016-03-25 DIAGNOSIS — E8881 Metabolic syndrome: Secondary | ICD-10-CM | POA: Diagnosis not present

## 2016-03-25 DIAGNOSIS — E875 Hyperkalemia: Secondary | ICD-10-CM | POA: Diagnosis not present

## 2016-03-25 DIAGNOSIS — G4733 Obstructive sleep apnea (adult) (pediatric): Secondary | ICD-10-CM | POA: Diagnosis not present

## 2016-03-25 DIAGNOSIS — E782 Mixed hyperlipidemia: Secondary | ICD-10-CM | POA: Diagnosis not present

## 2016-03-29 DIAGNOSIS — I8290 Acute embolism and thrombosis of unspecified vein: Secondary | ICD-10-CM | POA: Diagnosis not present

## 2016-03-29 DIAGNOSIS — I482 Chronic atrial fibrillation: Secondary | ICD-10-CM | POA: Diagnosis not present

## 2016-03-30 DIAGNOSIS — E6609 Other obesity due to excess calories: Secondary | ICD-10-CM | POA: Diagnosis not present

## 2016-03-30 DIAGNOSIS — E782 Mixed hyperlipidemia: Secondary | ICD-10-CM | POA: Diagnosis not present

## 2016-03-30 DIAGNOSIS — I10 Essential (primary) hypertension: Secondary | ICD-10-CM | POA: Diagnosis not present

## 2016-03-30 DIAGNOSIS — E1142 Type 2 diabetes mellitus with diabetic polyneuropathy: Secondary | ICD-10-CM | POA: Diagnosis not present

## 2016-03-30 DIAGNOSIS — E039 Hypothyroidism, unspecified: Secondary | ICD-10-CM | POA: Diagnosis not present

## 2016-03-30 DIAGNOSIS — E8881 Metabolic syndrome: Secondary | ICD-10-CM | POA: Diagnosis not present

## 2016-03-30 DIAGNOSIS — G6289 Other specified polyneuropathies: Secondary | ICD-10-CM | POA: Diagnosis not present

## 2016-03-30 DIAGNOSIS — I2782 Chronic pulmonary embolism: Secondary | ICD-10-CM | POA: Diagnosis not present

## 2016-04-12 DIAGNOSIS — Z6836 Body mass index (BMI) 36.0-36.9, adult: Secondary | ICD-10-CM | POA: Diagnosis not present

## 2016-04-12 DIAGNOSIS — J209 Acute bronchitis, unspecified: Secondary | ICD-10-CM | POA: Diagnosis not present

## 2016-04-12 DIAGNOSIS — R0902 Hypoxemia: Secondary | ICD-10-CM | POA: Diagnosis not present

## 2016-05-03 DIAGNOSIS — I8290 Acute embolism and thrombosis of unspecified vein: Secondary | ICD-10-CM | POA: Diagnosis not present

## 2016-05-03 DIAGNOSIS — I2782 Chronic pulmonary embolism: Secondary | ICD-10-CM | POA: Diagnosis not present

## 2016-05-03 DIAGNOSIS — I482 Chronic atrial fibrillation: Secondary | ICD-10-CM | POA: Diagnosis not present

## 2016-06-07 DIAGNOSIS — I482 Chronic atrial fibrillation: Secondary | ICD-10-CM | POA: Diagnosis not present

## 2016-07-13 DIAGNOSIS — L570 Actinic keratosis: Secondary | ICD-10-CM | POA: Diagnosis not present

## 2016-07-13 DIAGNOSIS — L821 Other seborrheic keratosis: Secondary | ICD-10-CM | POA: Diagnosis not present

## 2016-07-13 DIAGNOSIS — D1801 Hemangioma of skin and subcutaneous tissue: Secondary | ICD-10-CM | POA: Diagnosis not present

## 2016-07-19 DIAGNOSIS — I2782 Chronic pulmonary embolism: Secondary | ICD-10-CM | POA: Diagnosis not present

## 2016-07-19 DIAGNOSIS — I482 Chronic atrial fibrillation: Secondary | ICD-10-CM | POA: Diagnosis not present

## 2016-08-30 DIAGNOSIS — I2782 Chronic pulmonary embolism: Secondary | ICD-10-CM | POA: Diagnosis not present

## 2016-08-30 DIAGNOSIS — I8290 Acute embolism and thrombosis of unspecified vein: Secondary | ICD-10-CM | POA: Diagnosis not present

## 2016-08-30 DIAGNOSIS — I482 Chronic atrial fibrillation: Secondary | ICD-10-CM | POA: Diagnosis not present

## 2016-09-12 DIAGNOSIS — E782 Mixed hyperlipidemia: Secondary | ICD-10-CM | POA: Diagnosis not present

## 2016-09-12 DIAGNOSIS — E875 Hyperkalemia: Secondary | ICD-10-CM | POA: Diagnosis not present

## 2016-09-12 DIAGNOSIS — G4733 Obstructive sleep apnea (adult) (pediatric): Secondary | ICD-10-CM | POA: Diagnosis not present

## 2016-09-12 DIAGNOSIS — E1142 Type 2 diabetes mellitus with diabetic polyneuropathy: Secondary | ICD-10-CM | POA: Diagnosis not present

## 2016-09-12 DIAGNOSIS — E8881 Metabolic syndrome: Secondary | ICD-10-CM | POA: Diagnosis not present

## 2016-09-12 DIAGNOSIS — I10 Essential (primary) hypertension: Secondary | ICD-10-CM | POA: Diagnosis not present

## 2016-09-12 DIAGNOSIS — I482 Chronic atrial fibrillation: Secondary | ICD-10-CM | POA: Diagnosis not present

## 2016-09-12 DIAGNOSIS — Z9189 Other specified personal risk factors, not elsewhere classified: Secondary | ICD-10-CM | POA: Diagnosis not present

## 2016-09-15 DIAGNOSIS — Z0001 Encounter for general adult medical examination with abnormal findings: Secondary | ICD-10-CM | POA: Diagnosis not present

## 2016-09-15 DIAGNOSIS — E1142 Type 2 diabetes mellitus with diabetic polyneuropathy: Secondary | ICD-10-CM | POA: Diagnosis not present

## 2016-09-15 DIAGNOSIS — Z1212 Encounter for screening for malignant neoplasm of rectum: Secondary | ICD-10-CM | POA: Diagnosis not present

## 2016-09-15 DIAGNOSIS — Z23 Encounter for immunization: Secondary | ICD-10-CM | POA: Diagnosis not present

## 2016-09-15 DIAGNOSIS — Z6837 Body mass index (BMI) 37.0-37.9, adult: Secondary | ICD-10-CM | POA: Diagnosis not present

## 2016-09-15 DIAGNOSIS — I2510 Atherosclerotic heart disease of native coronary artery without angina pectoris: Secondary | ICD-10-CM | POA: Diagnosis not present

## 2016-09-15 DIAGNOSIS — I10 Essential (primary) hypertension: Secondary | ICD-10-CM | POA: Diagnosis not present

## 2016-09-20 DIAGNOSIS — H43811 Vitreous degeneration, right eye: Secondary | ICD-10-CM | POA: Diagnosis not present

## 2016-10-11 DIAGNOSIS — I482 Chronic atrial fibrillation: Secondary | ICD-10-CM | POA: Diagnosis not present

## 2016-10-11 DIAGNOSIS — I8290 Acute embolism and thrombosis of unspecified vein: Secondary | ICD-10-CM | POA: Diagnosis not present

## 2016-10-17 DIAGNOSIS — H43813 Vitreous degeneration, bilateral: Secondary | ICD-10-CM | POA: Diagnosis not present

## 2016-10-17 DIAGNOSIS — H31003 Unspecified chorioretinal scars, bilateral: Secondary | ICD-10-CM | POA: Diagnosis not present

## 2016-10-17 DIAGNOSIS — H26493 Other secondary cataract, bilateral: Secondary | ICD-10-CM | POA: Diagnosis not present

## 2016-10-17 DIAGNOSIS — H26491 Other secondary cataract, right eye: Secondary | ICD-10-CM | POA: Diagnosis not present

## 2016-10-17 DIAGNOSIS — H35361 Drusen (degenerative) of macula, right eye: Secondary | ICD-10-CM | POA: Diagnosis not present

## 2016-11-10 DIAGNOSIS — H26493 Other secondary cataract, bilateral: Secondary | ICD-10-CM | POA: Diagnosis not present

## 2016-11-10 DIAGNOSIS — H26492 Other secondary cataract, left eye: Secondary | ICD-10-CM | POA: Diagnosis not present

## 2016-11-23 DIAGNOSIS — I482 Chronic atrial fibrillation: Secondary | ICD-10-CM | POA: Diagnosis not present

## 2016-11-23 DIAGNOSIS — I8290 Acute embolism and thrombosis of unspecified vein: Secondary | ICD-10-CM | POA: Diagnosis not present

## 2016-12-22 DIAGNOSIS — I10 Essential (primary) hypertension: Secondary | ICD-10-CM | POA: Diagnosis not present

## 2017-01-04 DIAGNOSIS — I8290 Acute embolism and thrombosis of unspecified vein: Secondary | ICD-10-CM | POA: Diagnosis not present

## 2017-01-04 DIAGNOSIS — I482 Chronic atrial fibrillation: Secondary | ICD-10-CM | POA: Diagnosis not present

## 2017-01-25 DIAGNOSIS — I10 Essential (primary) hypertension: Secondary | ICD-10-CM | POA: Diagnosis not present

## 2017-01-25 DIAGNOSIS — Z6838 Body mass index (BMI) 38.0-38.9, adult: Secondary | ICD-10-CM | POA: Diagnosis not present

## 2017-01-25 DIAGNOSIS — I8290 Acute embolism and thrombosis of unspecified vein: Secondary | ICD-10-CM | POA: Diagnosis not present

## 2017-01-25 DIAGNOSIS — I240 Acute coronary thrombosis not resulting in myocardial infarction: Secondary | ICD-10-CM | POA: Diagnosis not present

## 2017-02-23 DIAGNOSIS — I482 Chronic atrial fibrillation: Secondary | ICD-10-CM | POA: Diagnosis not present

## 2017-02-23 DIAGNOSIS — I2782 Chronic pulmonary embolism: Secondary | ICD-10-CM | POA: Diagnosis not present

## 2017-03-01 DIAGNOSIS — R05 Cough: Secondary | ICD-10-CM | POA: Diagnosis not present

## 2017-03-01 DIAGNOSIS — Z6838 Body mass index (BMI) 38.0-38.9, adult: Secondary | ICD-10-CM | POA: Diagnosis not present

## 2017-03-03 DIAGNOSIS — I482 Chronic atrial fibrillation: Secondary | ICD-10-CM | POA: Diagnosis not present

## 2017-03-10 DIAGNOSIS — I2782 Chronic pulmonary embolism: Secondary | ICD-10-CM | POA: Diagnosis not present

## 2017-03-10 DIAGNOSIS — I240 Acute coronary thrombosis not resulting in myocardial infarction: Secondary | ICD-10-CM | POA: Diagnosis not present

## 2017-03-29 DIAGNOSIS — J4550 Severe persistent asthma, uncomplicated: Secondary | ICD-10-CM | POA: Diagnosis not present

## 2017-03-29 DIAGNOSIS — Z6838 Body mass index (BMI) 38.0-38.9, adult: Secondary | ICD-10-CM | POA: Diagnosis not present

## 2017-03-29 DIAGNOSIS — R0902 Hypoxemia: Secondary | ICD-10-CM | POA: Diagnosis not present

## 2017-03-29 DIAGNOSIS — I10 Essential (primary) hypertension: Secondary | ICD-10-CM | POA: Diagnosis not present

## 2017-03-29 DIAGNOSIS — I2782 Chronic pulmonary embolism: Secondary | ICD-10-CM | POA: Diagnosis not present

## 2017-03-29 DIAGNOSIS — I2510 Atherosclerotic heart disease of native coronary artery without angina pectoris: Secondary | ICD-10-CM | POA: Diagnosis not present

## 2017-04-05 DIAGNOSIS — I240 Acute coronary thrombosis not resulting in myocardial infarction: Secondary | ICD-10-CM | POA: Diagnosis not present

## 2017-04-05 DIAGNOSIS — I482 Chronic atrial fibrillation: Secondary | ICD-10-CM | POA: Diagnosis not present

## 2017-04-19 DIAGNOSIS — I2782 Chronic pulmonary embolism: Secondary | ICD-10-CM | POA: Diagnosis not present

## 2017-04-19 DIAGNOSIS — I482 Chronic atrial fibrillation: Secondary | ICD-10-CM | POA: Diagnosis not present

## 2017-05-02 DIAGNOSIS — Z961 Presence of intraocular lens: Secondary | ICD-10-CM | POA: Diagnosis not present

## 2017-05-31 DIAGNOSIS — I482 Chronic atrial fibrillation: Secondary | ICD-10-CM | POA: Diagnosis not present

## 2017-07-12 DIAGNOSIS — I482 Chronic atrial fibrillation: Secondary | ICD-10-CM | POA: Diagnosis not present

## 2017-07-18 DIAGNOSIS — L570 Actinic keratosis: Secondary | ICD-10-CM | POA: Diagnosis not present

## 2017-08-17 DIAGNOSIS — E782 Mixed hyperlipidemia: Secondary | ICD-10-CM | POA: Diagnosis not present

## 2017-08-17 DIAGNOSIS — Z6838 Body mass index (BMI) 38.0-38.9, adult: Secondary | ICD-10-CM | POA: Diagnosis not present

## 2017-08-17 DIAGNOSIS — I10 Essential (primary) hypertension: Secondary | ICD-10-CM | POA: Diagnosis not present

## 2017-08-17 DIAGNOSIS — R5383 Other fatigue: Secondary | ICD-10-CM | POA: Diagnosis not present

## 2017-08-23 DIAGNOSIS — I240 Acute coronary thrombosis not resulting in myocardial infarction: Secondary | ICD-10-CM | POA: Diagnosis not present

## 2017-09-14 DIAGNOSIS — G4733 Obstructive sleep apnea (adult) (pediatric): Secondary | ICD-10-CM | POA: Diagnosis not present

## 2017-09-14 DIAGNOSIS — R5383 Other fatigue: Secondary | ICD-10-CM | POA: Diagnosis not present

## 2017-09-16 DIAGNOSIS — G4733 Obstructive sleep apnea (adult) (pediatric): Secondary | ICD-10-CM | POA: Diagnosis not present

## 2017-09-16 DIAGNOSIS — R5383 Other fatigue: Secondary | ICD-10-CM | POA: Diagnosis not present

## 2017-09-27 DIAGNOSIS — I240 Acute coronary thrombosis not resulting in myocardial infarction: Secondary | ICD-10-CM | POA: Diagnosis not present

## 2017-09-27 DIAGNOSIS — Z9189 Other specified personal risk factors, not elsewhere classified: Secondary | ICD-10-CM | POA: Diagnosis not present

## 2017-10-23 DIAGNOSIS — I10 Essential (primary) hypertension: Secondary | ICD-10-CM | POA: Diagnosis not present

## 2017-10-23 DIAGNOSIS — E039 Hypothyroidism, unspecified: Secondary | ICD-10-CM | POA: Diagnosis not present

## 2017-10-23 DIAGNOSIS — E782 Mixed hyperlipidemia: Secondary | ICD-10-CM | POA: Diagnosis not present

## 2017-10-23 DIAGNOSIS — E1142 Type 2 diabetes mellitus with diabetic polyneuropathy: Secondary | ICD-10-CM | POA: Diagnosis not present

## 2017-10-23 DIAGNOSIS — E8881 Metabolic syndrome: Secondary | ICD-10-CM | POA: Diagnosis not present

## 2017-10-23 DIAGNOSIS — G4733 Obstructive sleep apnea (adult) (pediatric): Secondary | ICD-10-CM | POA: Diagnosis not present

## 2017-10-23 DIAGNOSIS — Z9189 Other specified personal risk factors, not elsewhere classified: Secondary | ICD-10-CM | POA: Diagnosis not present

## 2017-10-23 DIAGNOSIS — R0902 Hypoxemia: Secondary | ICD-10-CM | POA: Diagnosis not present

## 2017-10-26 DIAGNOSIS — Z23 Encounter for immunization: Secondary | ICD-10-CM | POA: Diagnosis not present

## 2017-10-26 DIAGNOSIS — I10 Essential (primary) hypertension: Secondary | ICD-10-CM | POA: Diagnosis not present

## 2017-10-26 DIAGNOSIS — E1142 Type 2 diabetes mellitus with diabetic polyneuropathy: Secondary | ICD-10-CM | POA: Diagnosis not present

## 2017-10-26 DIAGNOSIS — Z6838 Body mass index (BMI) 38.0-38.9, adult: Secondary | ICD-10-CM | POA: Diagnosis not present

## 2017-10-26 DIAGNOSIS — Z0001 Encounter for general adult medical examination with abnormal findings: Secondary | ICD-10-CM | POA: Diagnosis not present

## 2017-11-08 DIAGNOSIS — I8290 Acute embolism and thrombosis of unspecified vein: Secondary | ICD-10-CM | POA: Diagnosis not present

## 2017-12-05 ENCOUNTER — Other Ambulatory Visit: Payer: Self-pay

## 2017-12-20 DIAGNOSIS — I2782 Chronic pulmonary embolism: Secondary | ICD-10-CM | POA: Diagnosis not present

## 2017-12-20 DIAGNOSIS — I240 Acute coronary thrombosis not resulting in myocardial infarction: Secondary | ICD-10-CM | POA: Diagnosis not present

## 2018-01-31 DIAGNOSIS — I8290 Acute embolism and thrombosis of unspecified vein: Secondary | ICD-10-CM | POA: Diagnosis not present

## 2018-01-31 DIAGNOSIS — I240 Acute coronary thrombosis not resulting in myocardial infarction: Secondary | ICD-10-CM | POA: Diagnosis not present

## 2018-02-01 DIAGNOSIS — H43811 Vitreous degeneration, right eye: Secondary | ICD-10-CM | POA: Diagnosis not present

## 2018-03-14 DIAGNOSIS — I2782 Chronic pulmonary embolism: Secondary | ICD-10-CM | POA: Diagnosis not present

## 2018-03-14 DIAGNOSIS — I8290 Acute embolism and thrombosis of unspecified vein: Secondary | ICD-10-CM | POA: Diagnosis not present

## 2018-03-19 DIAGNOSIS — J189 Pneumonia, unspecified organism: Secondary | ICD-10-CM | POA: Diagnosis not present

## 2018-03-19 DIAGNOSIS — Z86711 Personal history of pulmonary embolism: Secondary | ICD-10-CM | POA: Diagnosis not present

## 2018-03-19 DIAGNOSIS — Z6839 Body mass index (BMI) 39.0-39.9, adult: Secondary | ICD-10-CM | POA: Diagnosis not present

## 2018-03-19 DIAGNOSIS — I10 Essential (primary) hypertension: Secondary | ICD-10-CM | POA: Diagnosis not present

## 2018-03-19 DIAGNOSIS — E1142 Type 2 diabetes mellitus with diabetic polyneuropathy: Secondary | ICD-10-CM | POA: Diagnosis not present

## 2018-03-22 DIAGNOSIS — Z86711 Personal history of pulmonary embolism: Secondary | ICD-10-CM | POA: Diagnosis not present

## 2018-03-22 DIAGNOSIS — I10 Essential (primary) hypertension: Secondary | ICD-10-CM | POA: Diagnosis not present

## 2018-03-22 DIAGNOSIS — J189 Pneumonia, unspecified organism: Secondary | ICD-10-CM | POA: Diagnosis not present

## 2018-03-22 DIAGNOSIS — R0902 Hypoxemia: Secondary | ICD-10-CM | POA: Diagnosis not present

## 2018-03-22 DIAGNOSIS — Z6838 Body mass index (BMI) 38.0-38.9, adult: Secondary | ICD-10-CM | POA: Diagnosis not present

## 2018-03-22 DIAGNOSIS — E1142 Type 2 diabetes mellitus with diabetic polyneuropathy: Secondary | ICD-10-CM | POA: Diagnosis not present

## 2018-04-09 DIAGNOSIS — R05 Cough: Secondary | ICD-10-CM | POA: Diagnosis not present

## 2018-04-09 DIAGNOSIS — R0602 Shortness of breath: Secondary | ICD-10-CM | POA: Diagnosis not present

## 2018-04-09 DIAGNOSIS — J209 Acute bronchitis, unspecified: Secondary | ICD-10-CM | POA: Diagnosis not present

## 2018-04-09 DIAGNOSIS — J440 Chronic obstructive pulmonary disease with acute lower respiratory infection: Secondary | ICD-10-CM | POA: Diagnosis not present

## 2018-04-23 DIAGNOSIS — G4733 Obstructive sleep apnea (adult) (pediatric): Secondary | ICD-10-CM | POA: Diagnosis not present

## 2018-04-23 DIAGNOSIS — E8881 Metabolic syndrome: Secondary | ICD-10-CM | POA: Diagnosis not present

## 2018-04-23 DIAGNOSIS — E039 Hypothyroidism, unspecified: Secondary | ICD-10-CM | POA: Diagnosis not present

## 2018-04-23 DIAGNOSIS — I10 Essential (primary) hypertension: Secondary | ICD-10-CM | POA: Diagnosis not present

## 2018-04-23 DIAGNOSIS — E782 Mixed hyperlipidemia: Secondary | ICD-10-CM | POA: Diagnosis not present

## 2018-04-23 DIAGNOSIS — E875 Hyperkalemia: Secondary | ICD-10-CM | POA: Diagnosis not present

## 2018-04-23 DIAGNOSIS — E1142 Type 2 diabetes mellitus with diabetic polyneuropathy: Secondary | ICD-10-CM | POA: Diagnosis not present

## 2018-04-23 DIAGNOSIS — R0902 Hypoxemia: Secondary | ICD-10-CM | POA: Diagnosis not present

## 2018-04-23 DIAGNOSIS — J440 Chronic obstructive pulmonary disease with acute lower respiratory infection: Secondary | ICD-10-CM | POA: Diagnosis not present

## 2018-04-23 DIAGNOSIS — I2782 Chronic pulmonary embolism: Secondary | ICD-10-CM | POA: Diagnosis not present

## 2018-04-25 DIAGNOSIS — I10 Essential (primary) hypertension: Secondary | ICD-10-CM | POA: Diagnosis not present

## 2018-04-25 DIAGNOSIS — E782 Mixed hyperlipidemia: Secondary | ICD-10-CM | POA: Diagnosis not present

## 2018-04-25 DIAGNOSIS — E1142 Type 2 diabetes mellitus with diabetic polyneuropathy: Secondary | ICD-10-CM | POA: Diagnosis not present

## 2018-04-25 DIAGNOSIS — J9691 Respiratory failure, unspecified with hypoxia: Secondary | ICD-10-CM | POA: Diagnosis not present

## 2018-04-25 DIAGNOSIS — I2782 Chronic pulmonary embolism: Secondary | ICD-10-CM | POA: Diagnosis not present

## 2018-04-25 DIAGNOSIS — Z6838 Body mass index (BMI) 38.0-38.9, adult: Secondary | ICD-10-CM | POA: Diagnosis not present

## 2018-04-25 DIAGNOSIS — D692 Other nonthrombocytopenic purpura: Secondary | ICD-10-CM | POA: Diagnosis not present

## 2018-04-25 DIAGNOSIS — I8290 Acute embolism and thrombosis of unspecified vein: Secondary | ICD-10-CM | POA: Diagnosis not present

## 2018-06-06 DIAGNOSIS — I240 Acute coronary thrombosis not resulting in myocardial infarction: Secondary | ICD-10-CM | POA: Diagnosis not present

## 2018-06-06 DIAGNOSIS — I2782 Chronic pulmonary embolism: Secondary | ICD-10-CM | POA: Diagnosis not present

## 2018-07-18 DIAGNOSIS — I2782 Chronic pulmonary embolism: Secondary | ICD-10-CM | POA: Diagnosis not present

## 2018-07-18 DIAGNOSIS — I8290 Acute embolism and thrombosis of unspecified vein: Secondary | ICD-10-CM | POA: Diagnosis not present

## 2018-07-18 DIAGNOSIS — L570 Actinic keratosis: Secondary | ICD-10-CM | POA: Diagnosis not present

## 2018-07-18 DIAGNOSIS — L819 Disorder of pigmentation, unspecified: Secondary | ICD-10-CM | POA: Diagnosis not present

## 2018-07-18 DIAGNOSIS — L821 Other seborrheic keratosis: Secondary | ICD-10-CM | POA: Diagnosis not present

## 2018-08-29 DIAGNOSIS — I8290 Acute embolism and thrombosis of unspecified vein: Secondary | ICD-10-CM | POA: Diagnosis not present

## 2018-08-29 DIAGNOSIS — I2782 Chronic pulmonary embolism: Secondary | ICD-10-CM | POA: Diagnosis not present

## 2018-10-03 DIAGNOSIS — Z86711 Personal history of pulmonary embolism: Secondary | ICD-10-CM | POA: Diagnosis not present

## 2018-10-10 DIAGNOSIS — Z86711 Personal history of pulmonary embolism: Secondary | ICD-10-CM | POA: Diagnosis not present

## 2018-10-10 DIAGNOSIS — I2782 Chronic pulmonary embolism: Secondary | ICD-10-CM | POA: Diagnosis not present

## 2018-10-22 DIAGNOSIS — Z23 Encounter for immunization: Secondary | ICD-10-CM | POA: Diagnosis not present

## 2018-10-25 DIAGNOSIS — E1142 Type 2 diabetes mellitus with diabetic polyneuropathy: Secondary | ICD-10-CM | POA: Diagnosis not present

## 2018-10-25 DIAGNOSIS — I10 Essential (primary) hypertension: Secondary | ICD-10-CM | POA: Diagnosis not present

## 2018-10-25 DIAGNOSIS — G4733 Obstructive sleep apnea (adult) (pediatric): Secondary | ICD-10-CM | POA: Diagnosis not present

## 2018-10-25 DIAGNOSIS — E039 Hypothyroidism, unspecified: Secondary | ICD-10-CM | POA: Diagnosis not present

## 2018-10-25 DIAGNOSIS — E8881 Metabolic syndrome: Secondary | ICD-10-CM | POA: Diagnosis not present

## 2018-10-25 DIAGNOSIS — J440 Chronic obstructive pulmonary disease with acute lower respiratory infection: Secondary | ICD-10-CM | POA: Diagnosis not present

## 2018-10-25 DIAGNOSIS — E782 Mixed hyperlipidemia: Secondary | ICD-10-CM | POA: Diagnosis not present

## 2018-10-25 DIAGNOSIS — I2782 Chronic pulmonary embolism: Secondary | ICD-10-CM | POA: Diagnosis not present

## 2018-10-29 DIAGNOSIS — I2782 Chronic pulmonary embolism: Secondary | ICD-10-CM | POA: Diagnosis not present

## 2018-10-29 DIAGNOSIS — Z0001 Encounter for general adult medical examination with abnormal findings: Secondary | ICD-10-CM | POA: Diagnosis not present

## 2018-10-29 DIAGNOSIS — J9691 Respiratory failure, unspecified with hypoxia: Secondary | ICD-10-CM | POA: Diagnosis not present

## 2018-10-29 DIAGNOSIS — Z23 Encounter for immunization: Secondary | ICD-10-CM | POA: Diagnosis not present

## 2018-10-29 DIAGNOSIS — E1142 Type 2 diabetes mellitus with diabetic polyneuropathy: Secondary | ICD-10-CM | POA: Diagnosis not present

## 2018-10-29 DIAGNOSIS — I25119 Atherosclerotic heart disease of native coronary artery with unspecified angina pectoris: Secondary | ICD-10-CM | POA: Diagnosis not present

## 2018-10-29 DIAGNOSIS — I10 Essential (primary) hypertension: Secondary | ICD-10-CM | POA: Diagnosis not present

## 2018-10-29 DIAGNOSIS — Z6838 Body mass index (BMI) 38.0-38.9, adult: Secondary | ICD-10-CM | POA: Diagnosis not present

## 2018-11-05 DIAGNOSIS — J4540 Moderate persistent asthma, uncomplicated: Secondary | ICD-10-CM | POA: Diagnosis not present

## 2018-11-14 DIAGNOSIS — Z86711 Personal history of pulmonary embolism: Secondary | ICD-10-CM | POA: Diagnosis not present

## 2018-11-14 DIAGNOSIS — I2782 Chronic pulmonary embolism: Secondary | ICD-10-CM | POA: Diagnosis not present

## 2018-12-26 DIAGNOSIS — I482 Chronic atrial fibrillation, unspecified: Secondary | ICD-10-CM | POA: Diagnosis not present

## 2018-12-26 DIAGNOSIS — I240 Acute coronary thrombosis not resulting in myocardial infarction: Secondary | ICD-10-CM | POA: Diagnosis not present

## 2019-01-01 DIAGNOSIS — J209 Acute bronchitis, unspecified: Secondary | ICD-10-CM | POA: Diagnosis not present

## 2019-01-28 DIAGNOSIS — N419 Inflammatory disease of prostate, unspecified: Secondary | ICD-10-CM | POA: Diagnosis not present

## 2019-01-28 DIAGNOSIS — R300 Dysuria: Secondary | ICD-10-CM | POA: Diagnosis not present

## 2019-01-31 DIAGNOSIS — R918 Other nonspecific abnormal finding of lung field: Secondary | ICD-10-CM | POA: Diagnosis not present

## 2019-01-31 DIAGNOSIS — K8020 Calculus of gallbladder without cholecystitis without obstruction: Secondary | ICD-10-CM | POA: Diagnosis not present

## 2019-01-31 DIAGNOSIS — D62 Acute posthemorrhagic anemia: Secondary | ICD-10-CM | POA: Diagnosis not present

## 2019-01-31 DIAGNOSIS — K921 Melena: Secondary | ICD-10-CM | POA: Diagnosis not present

## 2019-01-31 DIAGNOSIS — K625 Hemorrhage of anus and rectum: Secondary | ICD-10-CM | POA: Diagnosis not present

## 2019-01-31 DIAGNOSIS — R58 Hemorrhage, not elsewhere classified: Secondary | ICD-10-CM | POA: Diagnosis not present

## 2019-01-31 DIAGNOSIS — I4821 Permanent atrial fibrillation: Secondary | ICD-10-CM | POA: Diagnosis not present

## 2019-01-31 DIAGNOSIS — D6832 Hemorrhagic disorder due to extrinsic circulating anticoagulants: Secondary | ICD-10-CM | POA: Diagnosis not present

## 2019-01-31 DIAGNOSIS — K2971 Gastritis, unspecified, with bleeding: Secondary | ICD-10-CM | POA: Diagnosis not present

## 2019-01-31 DIAGNOSIS — R509 Fever, unspecified: Secondary | ICD-10-CM | POA: Diagnosis not present

## 2019-01-31 DIAGNOSIS — K922 Gastrointestinal hemorrhage, unspecified: Secondary | ICD-10-CM | POA: Diagnosis not present

## 2019-01-31 DIAGNOSIS — J9611 Chronic respiratory failure with hypoxia: Secondary | ICD-10-CM | POA: Diagnosis not present

## 2019-01-31 DIAGNOSIS — I959 Hypotension, unspecified: Secondary | ICD-10-CM | POA: Diagnosis not present

## 2019-01-31 DIAGNOSIS — D6859 Other primary thrombophilia: Secondary | ICD-10-CM | POA: Diagnosis not present

## 2019-01-31 DIAGNOSIS — D689 Coagulation defect, unspecified: Secondary | ICD-10-CM | POA: Diagnosis not present

## 2019-02-01 DIAGNOSIS — K625 Hemorrhage of anus and rectum: Secondary | ICD-10-CM | POA: Diagnosis not present

## 2019-02-02 DIAGNOSIS — I2510 Atherosclerotic heart disease of native coronary artery without angina pectoris: Secondary | ICD-10-CM | POA: Diagnosis present

## 2019-02-02 DIAGNOSIS — J9611 Chronic respiratory failure with hypoxia: Secondary | ICD-10-CM | POA: Diagnosis present

## 2019-02-02 DIAGNOSIS — Z96653 Presence of artificial knee joint, bilateral: Secondary | ICD-10-CM | POA: Diagnosis present

## 2019-02-02 DIAGNOSIS — K2971 Gastritis, unspecified, with bleeding: Secondary | ICD-10-CM | POA: Diagnosis present

## 2019-02-02 DIAGNOSIS — K254 Chronic or unspecified gastric ulcer with hemorrhage: Secondary | ICD-10-CM | POA: Diagnosis present

## 2019-02-02 DIAGNOSIS — J4540 Moderate persistent asthma, uncomplicated: Secondary | ICD-10-CM | POA: Diagnosis present

## 2019-02-02 DIAGNOSIS — Z86711 Personal history of pulmonary embolism: Secondary | ICD-10-CM | POA: Diagnosis not present

## 2019-02-02 DIAGNOSIS — K922 Gastrointestinal hemorrhage, unspecified: Secondary | ICD-10-CM | POA: Diagnosis not present

## 2019-02-02 DIAGNOSIS — Z7982 Long term (current) use of aspirin: Secondary | ICD-10-CM | POA: Diagnosis not present

## 2019-02-02 DIAGNOSIS — D6859 Other primary thrombophilia: Secondary | ICD-10-CM | POA: Diagnosis present

## 2019-02-02 DIAGNOSIS — Z20822 Contact with and (suspected) exposure to covid-19: Secondary | ICD-10-CM | POA: Diagnosis present

## 2019-02-02 DIAGNOSIS — I4821 Permanent atrial fibrillation: Secondary | ICD-10-CM | POA: Diagnosis present

## 2019-02-02 DIAGNOSIS — E039 Hypothyroidism, unspecified: Secondary | ICD-10-CM | POA: Diagnosis present

## 2019-02-02 DIAGNOSIS — D6832 Hemorrhagic disorder due to extrinsic circulating anticoagulants: Secondary | ICD-10-CM | POA: Diagnosis present

## 2019-02-02 DIAGNOSIS — E785 Hyperlipidemia, unspecified: Secondary | ICD-10-CM | POA: Diagnosis present

## 2019-02-02 DIAGNOSIS — D689 Coagulation defect, unspecified: Secondary | ICD-10-CM | POA: Diagnosis not present

## 2019-02-02 DIAGNOSIS — K921 Melena: Secondary | ICD-10-CM | POA: Diagnosis not present

## 2019-02-02 DIAGNOSIS — Z87440 Personal history of urinary (tract) infections: Secondary | ICD-10-CM | POA: Diagnosis not present

## 2019-02-02 DIAGNOSIS — Z951 Presence of aortocoronary bypass graft: Secondary | ICD-10-CM | POA: Diagnosis not present

## 2019-02-02 DIAGNOSIS — I10 Essential (primary) hypertension: Secondary | ICD-10-CM | POA: Diagnosis present

## 2019-02-02 DIAGNOSIS — T45515A Adverse effect of anticoagulants, initial encounter: Secondary | ICD-10-CM | POA: Diagnosis present

## 2019-02-02 DIAGNOSIS — M1990 Unspecified osteoarthritis, unspecified site: Secondary | ICD-10-CM | POA: Diagnosis present

## 2019-02-02 DIAGNOSIS — D62 Acute posthemorrhagic anemia: Secondary | ICD-10-CM | POA: Diagnosis present

## 2019-02-07 DIAGNOSIS — B9681 Helicobacter pylori [H. pylori] as the cause of diseases classified elsewhere: Secondary | ICD-10-CM | POA: Diagnosis not present

## 2019-02-07 DIAGNOSIS — B998 Other infectious disease: Secondary | ICD-10-CM | POA: Diagnosis not present

## 2019-02-08 DIAGNOSIS — I482 Chronic atrial fibrillation, unspecified: Secondary | ICD-10-CM | POA: Diagnosis not present

## 2019-02-08 DIAGNOSIS — I2782 Chronic pulmonary embolism: Secondary | ICD-10-CM | POA: Diagnosis not present

## 2019-02-12 DIAGNOSIS — I482 Chronic atrial fibrillation, unspecified: Secondary | ICD-10-CM | POA: Diagnosis not present

## 2019-02-12 DIAGNOSIS — I2782 Chronic pulmonary embolism: Secondary | ICD-10-CM | POA: Diagnosis not present

## 2019-02-19 DIAGNOSIS — I482 Chronic atrial fibrillation, unspecified: Secondary | ICD-10-CM | POA: Diagnosis not present

## 2019-02-22 DIAGNOSIS — Z23 Encounter for immunization: Secondary | ICD-10-CM | POA: Diagnosis not present

## 2019-02-26 DIAGNOSIS — I482 Chronic atrial fibrillation, unspecified: Secondary | ICD-10-CM | POA: Diagnosis not present

## 2019-02-26 DIAGNOSIS — R05 Cough: Secondary | ICD-10-CM | POA: Diagnosis not present

## 2019-02-26 DIAGNOSIS — Z20828 Contact with and (suspected) exposure to other viral communicable diseases: Secondary | ICD-10-CM | POA: Diagnosis not present

## 2019-02-26 DIAGNOSIS — Z6837 Body mass index (BMI) 37.0-37.9, adult: Secondary | ICD-10-CM | POA: Diagnosis not present

## 2019-02-26 DIAGNOSIS — J441 Chronic obstructive pulmonary disease with (acute) exacerbation: Secondary | ICD-10-CM | POA: Diagnosis not present

## 2019-02-26 DIAGNOSIS — Z86711 Personal history of pulmonary embolism: Secondary | ICD-10-CM | POA: Diagnosis not present

## 2019-03-05 DIAGNOSIS — N400 Enlarged prostate without lower urinary tract symptoms: Secondary | ICD-10-CM | POA: Diagnosis not present

## 2019-03-05 DIAGNOSIS — D62 Acute posthemorrhagic anemia: Secondary | ICD-10-CM | POA: Diagnosis not present

## 2019-03-05 DIAGNOSIS — I4891 Unspecified atrial fibrillation: Secondary | ICD-10-CM | POA: Diagnosis not present

## 2019-03-05 DIAGNOSIS — Z6837 Body mass index (BMI) 37.0-37.9, adult: Secondary | ICD-10-CM | POA: Diagnosis not present

## 2019-03-05 DIAGNOSIS — K922 Gastrointestinal hemorrhage, unspecified: Secondary | ICD-10-CM | POA: Diagnosis not present

## 2019-03-13 DIAGNOSIS — I82890 Acute embolism and thrombosis of other specified veins: Secondary | ICD-10-CM | POA: Diagnosis not present

## 2019-03-13 DIAGNOSIS — I482 Chronic atrial fibrillation, unspecified: Secondary | ICD-10-CM | POA: Diagnosis not present

## 2019-03-18 DIAGNOSIS — N410 Acute prostatitis: Secondary | ICD-10-CM | POA: Diagnosis not present

## 2019-03-18 DIAGNOSIS — Z6837 Body mass index (BMI) 37.0-37.9, adult: Secondary | ICD-10-CM | POA: Diagnosis not present

## 2019-03-18 DIAGNOSIS — N3090 Cystitis, unspecified without hematuria: Secondary | ICD-10-CM | POA: Diagnosis not present

## 2019-03-20 DIAGNOSIS — I4891 Unspecified atrial fibrillation: Secondary | ICD-10-CM | POA: Diagnosis not present

## 2019-03-22 DIAGNOSIS — I4891 Unspecified atrial fibrillation: Secondary | ICD-10-CM | POA: Diagnosis not present

## 2019-03-23 DIAGNOSIS — Z23 Encounter for immunization: Secondary | ICD-10-CM | POA: Diagnosis not present

## 2019-03-29 DIAGNOSIS — I4891 Unspecified atrial fibrillation: Secondary | ICD-10-CM | POA: Diagnosis not present

## 2019-04-01 DIAGNOSIS — Z20828 Contact with and (suspected) exposure to other viral communicable diseases: Secondary | ICD-10-CM | POA: Diagnosis not present

## 2019-04-01 DIAGNOSIS — R05 Cough: Secondary | ICD-10-CM | POA: Diagnosis not present

## 2019-04-01 DIAGNOSIS — N3090 Cystitis, unspecified without hematuria: Secondary | ICD-10-CM | POA: Diagnosis not present

## 2019-04-01 DIAGNOSIS — R0602 Shortness of breath: Secondary | ICD-10-CM | POA: Diagnosis not present

## 2019-04-01 DIAGNOSIS — R300 Dysuria: Secondary | ICD-10-CM | POA: Diagnosis not present

## 2019-04-05 DIAGNOSIS — I4891 Unspecified atrial fibrillation: Secondary | ICD-10-CM | POA: Diagnosis not present

## 2019-04-15 DIAGNOSIS — I4891 Unspecified atrial fibrillation: Secondary | ICD-10-CM | POA: Diagnosis not present

## 2019-04-24 DIAGNOSIS — I482 Chronic atrial fibrillation, unspecified: Secondary | ICD-10-CM | POA: Diagnosis not present

## 2019-05-08 DIAGNOSIS — I4891 Unspecified atrial fibrillation: Secondary | ICD-10-CM | POA: Diagnosis not present

## 2019-05-21 DIAGNOSIS — I4891 Unspecified atrial fibrillation: Secondary | ICD-10-CM | POA: Diagnosis not present

## 2019-05-21 DIAGNOSIS — Z86711 Personal history of pulmonary embolism: Secondary | ICD-10-CM | POA: Diagnosis not present

## 2019-05-27 ENCOUNTER — Ambulatory Visit: Payer: TRICARE For Life (TFL) | Admitting: Urology

## 2019-05-28 DIAGNOSIS — I4891 Unspecified atrial fibrillation: Secondary | ICD-10-CM | POA: Diagnosis not present

## 2019-06-04 DIAGNOSIS — I4891 Unspecified atrial fibrillation: Secondary | ICD-10-CM | POA: Diagnosis not present

## 2019-06-11 DIAGNOSIS — I4891 Unspecified atrial fibrillation: Secondary | ICD-10-CM | POA: Diagnosis not present

## 2019-07-04 DIAGNOSIS — H35363 Drusen (degenerative) of macula, bilateral: Secondary | ICD-10-CM | POA: Diagnosis not present

## 2019-08-16 DIAGNOSIS — R05 Cough: Secondary | ICD-10-CM | POA: Diagnosis not present

## 2019-08-16 DIAGNOSIS — Z20828 Contact with and (suspected) exposure to other viral communicable diseases: Secondary | ICD-10-CM | POA: Diagnosis not present

## 2019-08-16 DIAGNOSIS — J441 Chronic obstructive pulmonary disease with (acute) exacerbation: Secondary | ICD-10-CM | POA: Diagnosis not present

## 2019-09-04 DIAGNOSIS — I4891 Unspecified atrial fibrillation: Secondary | ICD-10-CM | POA: Diagnosis not present

## 2019-09-17 DIAGNOSIS — J441 Chronic obstructive pulmonary disease with (acute) exacerbation: Secondary | ICD-10-CM | POA: Diagnosis not present

## 2019-09-20 DIAGNOSIS — I2782 Chronic pulmonary embolism: Secondary | ICD-10-CM | POA: Diagnosis not present

## 2019-09-20 DIAGNOSIS — I4891 Unspecified atrial fibrillation: Secondary | ICD-10-CM | POA: Diagnosis not present

## 2019-09-25 DIAGNOSIS — I4891 Unspecified atrial fibrillation: Secondary | ICD-10-CM | POA: Diagnosis not present

## 2019-09-26 DIAGNOSIS — E8881 Metabolic syndrome: Secondary | ICD-10-CM | POA: Diagnosis not present

## 2019-09-26 DIAGNOSIS — I482 Chronic atrial fibrillation, unspecified: Secondary | ICD-10-CM | POA: Diagnosis not present

## 2019-09-26 DIAGNOSIS — E039 Hypothyroidism, unspecified: Secondary | ICD-10-CM | POA: Diagnosis not present

## 2019-09-26 DIAGNOSIS — E1142 Type 2 diabetes mellitus with diabetic polyneuropathy: Secondary | ICD-10-CM | POA: Diagnosis not present

## 2019-09-26 DIAGNOSIS — I10 Essential (primary) hypertension: Secondary | ICD-10-CM | POA: Diagnosis not present

## 2019-09-26 DIAGNOSIS — E782 Mixed hyperlipidemia: Secondary | ICD-10-CM | POA: Diagnosis not present

## 2019-09-26 DIAGNOSIS — J440 Chronic obstructive pulmonary disease with acute lower respiratory infection: Secondary | ICD-10-CM | POA: Diagnosis not present

## 2019-10-01 DIAGNOSIS — I25119 Atherosclerotic heart disease of native coronary artery with unspecified angina pectoris: Secondary | ICD-10-CM | POA: Diagnosis not present

## 2019-10-01 DIAGNOSIS — D692 Other nonthrombocytopenic purpura: Secondary | ICD-10-CM | POA: Diagnosis not present

## 2019-10-01 DIAGNOSIS — Z6836 Body mass index (BMI) 36.0-36.9, adult: Secondary | ICD-10-CM | POA: Diagnosis not present

## 2019-10-01 DIAGNOSIS — E782 Mixed hyperlipidemia: Secondary | ICD-10-CM | POA: Diagnosis not present

## 2019-10-01 DIAGNOSIS — I10 Essential (primary) hypertension: Secondary | ICD-10-CM | POA: Diagnosis not present

## 2019-10-01 DIAGNOSIS — I2782 Chronic pulmonary embolism: Secondary | ICD-10-CM | POA: Diagnosis not present

## 2019-10-01 DIAGNOSIS — J9691 Respiratory failure, unspecified with hypoxia: Secondary | ICD-10-CM | POA: Diagnosis not present

## 2019-10-01 DIAGNOSIS — E1142 Type 2 diabetes mellitus with diabetic polyneuropathy: Secondary | ICD-10-CM | POA: Diagnosis not present

## 2019-10-15 DIAGNOSIS — Z23 Encounter for immunization: Secondary | ICD-10-CM | POA: Diagnosis not present

## 2019-10-30 DIAGNOSIS — I82890 Acute embolism and thrombosis of other specified veins: Secondary | ICD-10-CM | POA: Diagnosis not present

## 2019-10-30 DIAGNOSIS — I4891 Unspecified atrial fibrillation: Secondary | ICD-10-CM | POA: Diagnosis not present

## 2019-12-04 DIAGNOSIS — I482 Chronic atrial fibrillation, unspecified: Secondary | ICD-10-CM | POA: Diagnosis not present

## 2020-01-03 DIAGNOSIS — I10 Essential (primary) hypertension: Secondary | ICD-10-CM | POA: Diagnosis not present

## 2020-01-03 DIAGNOSIS — E1142 Type 2 diabetes mellitus with diabetic polyneuropathy: Secondary | ICD-10-CM | POA: Diagnosis not present

## 2020-01-03 DIAGNOSIS — E8881 Metabolic syndrome: Secondary | ICD-10-CM | POA: Diagnosis not present

## 2020-01-03 DIAGNOSIS — E782 Mixed hyperlipidemia: Secondary | ICD-10-CM | POA: Diagnosis not present

## 2020-01-03 DIAGNOSIS — E039 Hypothyroidism, unspecified: Secondary | ICD-10-CM | POA: Diagnosis not present

## 2020-01-07 DIAGNOSIS — R059 Cough, unspecified: Secondary | ICD-10-CM | POA: Diagnosis not present

## 2020-01-07 DIAGNOSIS — J441 Chronic obstructive pulmonary disease with (acute) exacerbation: Secondary | ICD-10-CM | POA: Diagnosis not present

## 2020-01-07 DIAGNOSIS — Z20828 Contact with and (suspected) exposure to other viral communicable diseases: Secondary | ICD-10-CM | POA: Diagnosis not present

## 2020-01-07 DIAGNOSIS — Z23 Encounter for immunization: Secondary | ICD-10-CM | POA: Diagnosis not present

## 2020-01-09 DIAGNOSIS — Z6836 Body mass index (BMI) 36.0-36.9, adult: Secondary | ICD-10-CM | POA: Diagnosis not present

## 2020-01-09 DIAGNOSIS — I2782 Chronic pulmonary embolism: Secondary | ICD-10-CM | POA: Diagnosis not present

## 2020-01-09 DIAGNOSIS — E1142 Type 2 diabetes mellitus with diabetic polyneuropathy: Secondary | ICD-10-CM | POA: Diagnosis not present

## 2020-01-09 DIAGNOSIS — I5032 Chronic diastolic (congestive) heart failure: Secondary | ICD-10-CM | POA: Diagnosis not present

## 2020-01-09 DIAGNOSIS — I10 Essential (primary) hypertension: Secondary | ICD-10-CM | POA: Diagnosis not present

## 2020-01-09 DIAGNOSIS — E8881 Metabolic syndrome: Secondary | ICD-10-CM | POA: Diagnosis not present

## 2020-01-09 DIAGNOSIS — E782 Mixed hyperlipidemia: Secondary | ICD-10-CM | POA: Diagnosis not present

## 2020-01-09 DIAGNOSIS — Z0001 Encounter for general adult medical examination with abnormal findings: Secondary | ICD-10-CM | POA: Diagnosis not present

## 2020-01-15 DIAGNOSIS — I4891 Unspecified atrial fibrillation: Secondary | ICD-10-CM | POA: Diagnosis not present

## 2020-01-16 DIAGNOSIS — J441 Chronic obstructive pulmonary disease with (acute) exacerbation: Secondary | ICD-10-CM | POA: Diagnosis not present

## 2020-01-24 DIAGNOSIS — I5032 Chronic diastolic (congestive) heart failure: Secondary | ICD-10-CM | POA: Diagnosis not present

## 2020-01-24 DIAGNOSIS — I088 Other rheumatic multiple valve diseases: Secondary | ICD-10-CM | POA: Diagnosis not present

## 2020-02-07 DIAGNOSIS — I2782 Chronic pulmonary embolism: Secondary | ICD-10-CM | POA: Diagnosis not present

## 2020-02-07 DIAGNOSIS — I10 Essential (primary) hypertension: Secondary | ICD-10-CM | POA: Diagnosis not present

## 2020-02-07 DIAGNOSIS — E1142 Type 2 diabetes mellitus with diabetic polyneuropathy: Secondary | ICD-10-CM | POA: Diagnosis not present

## 2020-02-07 DIAGNOSIS — I25119 Atherosclerotic heart disease of native coronary artery with unspecified angina pectoris: Secondary | ICD-10-CM | POA: Diagnosis not present

## 2020-02-07 DIAGNOSIS — E7849 Other hyperlipidemia: Secondary | ICD-10-CM | POA: Diagnosis not present

## 2020-02-07 DIAGNOSIS — J9691 Respiratory failure, unspecified with hypoxia: Secondary | ICD-10-CM | POA: Diagnosis not present

## 2020-02-07 DIAGNOSIS — E8881 Metabolic syndrome: Secondary | ICD-10-CM | POA: Diagnosis not present

## 2020-02-07 DIAGNOSIS — E039 Hypothyroidism, unspecified: Secondary | ICD-10-CM | POA: Diagnosis not present

## 2020-02-12 DIAGNOSIS — Z86711 Personal history of pulmonary embolism: Secondary | ICD-10-CM | POA: Diagnosis not present

## 2020-03-11 DIAGNOSIS — I4891 Unspecified atrial fibrillation: Secondary | ICD-10-CM | POA: Diagnosis not present

## 2020-04-16 DIAGNOSIS — I4891 Unspecified atrial fibrillation: Secondary | ICD-10-CM | POA: Diagnosis not present

## 2020-04-21 DIAGNOSIS — I4891 Unspecified atrial fibrillation: Secondary | ICD-10-CM | POA: Diagnosis not present

## 2020-05-28 DIAGNOSIS — E119 Type 2 diabetes mellitus without complications: Secondary | ICD-10-CM | POA: Diagnosis not present

## 2020-05-28 DIAGNOSIS — R0902 Hypoxemia: Secondary | ICD-10-CM | POA: Diagnosis not present

## 2020-05-28 DIAGNOSIS — I10 Essential (primary) hypertension: Secondary | ICD-10-CM | POA: Diagnosis not present

## 2020-05-28 DIAGNOSIS — E875 Hyperkalemia: Secondary | ICD-10-CM | POA: Diagnosis not present

## 2020-05-28 DIAGNOSIS — E039 Hypothyroidism, unspecified: Secondary | ICD-10-CM | POA: Diagnosis not present

## 2020-05-28 DIAGNOSIS — E782 Mixed hyperlipidemia: Secondary | ICD-10-CM | POA: Diagnosis not present

## 2020-05-28 DIAGNOSIS — E7849 Other hyperlipidemia: Secondary | ICD-10-CM | POA: Diagnosis not present

## 2020-05-28 DIAGNOSIS — E8881 Metabolic syndrome: Secondary | ICD-10-CM | POA: Diagnosis not present

## 2020-06-02 DIAGNOSIS — I10 Essential (primary) hypertension: Secondary | ICD-10-CM | POA: Diagnosis not present

## 2020-06-02 DIAGNOSIS — I5032 Chronic diastolic (congestive) heart failure: Secondary | ICD-10-CM | POA: Diagnosis not present

## 2020-06-02 DIAGNOSIS — E7849 Other hyperlipidemia: Secondary | ICD-10-CM | POA: Diagnosis not present

## 2020-06-02 DIAGNOSIS — E8881 Metabolic syndrome: Secondary | ICD-10-CM | POA: Diagnosis not present

## 2020-06-02 DIAGNOSIS — J9691 Respiratory failure, unspecified with hypoxia: Secondary | ICD-10-CM | POA: Diagnosis not present

## 2020-06-02 DIAGNOSIS — E1142 Type 2 diabetes mellitus with diabetic polyneuropathy: Secondary | ICD-10-CM | POA: Diagnosis not present

## 2020-06-02 DIAGNOSIS — I4891 Unspecified atrial fibrillation: Secondary | ICD-10-CM | POA: Diagnosis not present

## 2020-06-02 DIAGNOSIS — I2782 Chronic pulmonary embolism: Secondary | ICD-10-CM | POA: Diagnosis not present

## 2020-07-08 DIAGNOSIS — I82890 Acute embolism and thrombosis of other specified veins: Secondary | ICD-10-CM | POA: Diagnosis not present

## 2020-07-08 DIAGNOSIS — I240 Acute coronary thrombosis not resulting in myocardial infarction: Secondary | ICD-10-CM | POA: Diagnosis not present

## 2020-07-22 DIAGNOSIS — Z1283 Encounter for screening for malignant neoplasm of skin: Secondary | ICD-10-CM | POA: Diagnosis not present

## 2020-07-22 DIAGNOSIS — L820 Inflamed seborrheic keratosis: Secondary | ICD-10-CM | POA: Diagnosis not present

## 2020-07-22 DIAGNOSIS — L570 Actinic keratosis: Secondary | ICD-10-CM | POA: Diagnosis not present

## 2020-07-22 DIAGNOSIS — Z85828 Personal history of other malignant neoplasm of skin: Secondary | ICD-10-CM | POA: Diagnosis not present

## 2020-07-22 DIAGNOSIS — D485 Neoplasm of uncertain behavior of skin: Secondary | ICD-10-CM | POA: Diagnosis not present

## 2020-07-22 DIAGNOSIS — L814 Other melanin hyperpigmentation: Secondary | ICD-10-CM | POA: Diagnosis not present

## 2020-08-19 DIAGNOSIS — I4891 Unspecified atrial fibrillation: Secondary | ICD-10-CM | POA: Diagnosis not present

## 2020-08-26 DIAGNOSIS — E039 Hypothyroidism, unspecified: Secondary | ICD-10-CM | POA: Diagnosis not present

## 2020-08-26 DIAGNOSIS — E119 Type 2 diabetes mellitus without complications: Secondary | ICD-10-CM | POA: Diagnosis not present

## 2020-08-26 DIAGNOSIS — E7849 Other hyperlipidemia: Secondary | ICD-10-CM | POA: Diagnosis not present

## 2020-08-26 DIAGNOSIS — R5383 Other fatigue: Secondary | ICD-10-CM | POA: Diagnosis not present

## 2020-08-26 DIAGNOSIS — I10 Essential (primary) hypertension: Secondary | ICD-10-CM | POA: Diagnosis not present

## 2020-08-26 DIAGNOSIS — E782 Mixed hyperlipidemia: Secondary | ICD-10-CM | POA: Diagnosis not present

## 2020-08-26 DIAGNOSIS — E875 Hyperkalemia: Secondary | ICD-10-CM | POA: Diagnosis not present

## 2020-08-26 DIAGNOSIS — E8881 Metabolic syndrome: Secondary | ICD-10-CM | POA: Diagnosis not present

## 2020-08-31 DIAGNOSIS — E039 Hypothyroidism, unspecified: Secondary | ICD-10-CM | POA: Diagnosis not present

## 2020-08-31 DIAGNOSIS — E7849 Other hyperlipidemia: Secondary | ICD-10-CM | POA: Diagnosis not present

## 2020-08-31 DIAGNOSIS — I25119 Atherosclerotic heart disease of native coronary artery with unspecified angina pectoris: Secondary | ICD-10-CM | POA: Diagnosis not present

## 2020-08-31 DIAGNOSIS — J9691 Respiratory failure, unspecified with hypoxia: Secondary | ICD-10-CM | POA: Diagnosis not present

## 2020-08-31 DIAGNOSIS — I10 Essential (primary) hypertension: Secondary | ICD-10-CM | POA: Diagnosis not present

## 2020-08-31 DIAGNOSIS — I2782 Chronic pulmonary embolism: Secondary | ICD-10-CM | POA: Diagnosis not present

## 2020-08-31 DIAGNOSIS — E1142 Type 2 diabetes mellitus with diabetic polyneuropathy: Secondary | ICD-10-CM | POA: Diagnosis not present

## 2020-08-31 DIAGNOSIS — Z7189 Other specified counseling: Secondary | ICD-10-CM | POA: Diagnosis not present

## 2020-09-16 DIAGNOSIS — Z23 Encounter for immunization: Secondary | ICD-10-CM | POA: Diagnosis not present

## 2020-09-17 DIAGNOSIS — H35363 Drusen (degenerative) of macula, bilateral: Secondary | ICD-10-CM | POA: Diagnosis not present

## 2020-09-30 DIAGNOSIS — I482 Chronic atrial fibrillation, unspecified: Secondary | ICD-10-CM | POA: Diagnosis not present

## 2020-09-30 DIAGNOSIS — I8290 Acute embolism and thrombosis of unspecified vein: Secondary | ICD-10-CM | POA: Diagnosis not present

## 2020-09-30 DIAGNOSIS — Z23 Encounter for immunization: Secondary | ICD-10-CM | POA: Diagnosis not present

## 2020-11-11 DIAGNOSIS — I8290 Acute embolism and thrombosis of unspecified vein: Secondary | ICD-10-CM | POA: Diagnosis not present

## 2020-11-11 DIAGNOSIS — I482 Chronic atrial fibrillation, unspecified: Secondary | ICD-10-CM | POA: Diagnosis not present

## 2020-12-23 DIAGNOSIS — I4891 Unspecified atrial fibrillation: Secondary | ICD-10-CM | POA: Diagnosis not present

## 2020-12-23 DIAGNOSIS — Z86711 Personal history of pulmonary embolism: Secondary | ICD-10-CM | POA: Diagnosis not present

## 2020-12-23 DIAGNOSIS — I240 Acute coronary thrombosis not resulting in myocardial infarction: Secondary | ICD-10-CM | POA: Diagnosis not present

## 2021-01-07 DIAGNOSIS — E7849 Other hyperlipidemia: Secondary | ICD-10-CM | POA: Diagnosis not present

## 2021-01-07 DIAGNOSIS — E8881 Metabolic syndrome: Secondary | ICD-10-CM | POA: Diagnosis not present

## 2021-01-07 DIAGNOSIS — E039 Hypothyroidism, unspecified: Secondary | ICD-10-CM | POA: Diagnosis not present

## 2021-01-07 DIAGNOSIS — E119 Type 2 diabetes mellitus without complications: Secondary | ICD-10-CM | POA: Diagnosis not present

## 2021-01-07 DIAGNOSIS — I10 Essential (primary) hypertension: Secondary | ICD-10-CM | POA: Diagnosis not present

## 2021-01-07 DIAGNOSIS — E782 Mixed hyperlipidemia: Secondary | ICD-10-CM | POA: Diagnosis not present

## 2021-01-12 DIAGNOSIS — I10 Essential (primary) hypertension: Secondary | ICD-10-CM | POA: Diagnosis not present

## 2021-01-12 DIAGNOSIS — J9691 Respiratory failure, unspecified with hypoxia: Secondary | ICD-10-CM | POA: Diagnosis not present

## 2021-01-12 DIAGNOSIS — I2782 Chronic pulmonary embolism: Secondary | ICD-10-CM | POA: Diagnosis not present

## 2021-01-12 DIAGNOSIS — Z0001 Encounter for general adult medical examination with abnormal findings: Secondary | ICD-10-CM | POA: Diagnosis not present

## 2021-01-12 DIAGNOSIS — I5032 Chronic diastolic (congestive) heart failure: Secondary | ICD-10-CM | POA: Diagnosis not present

## 2021-01-12 DIAGNOSIS — N1832 Chronic kidney disease, stage 3b: Secondary | ICD-10-CM | POA: Diagnosis not present

## 2021-01-12 DIAGNOSIS — E1142 Type 2 diabetes mellitus with diabetic polyneuropathy: Secondary | ICD-10-CM | POA: Diagnosis not present

## 2021-01-12 DIAGNOSIS — E7849 Other hyperlipidemia: Secondary | ICD-10-CM | POA: Diagnosis not present

## 2021-02-03 DIAGNOSIS — I4891 Unspecified atrial fibrillation: Secondary | ICD-10-CM | POA: Diagnosis not present

## 2021-03-17 DIAGNOSIS — I4891 Unspecified atrial fibrillation: Secondary | ICD-10-CM | POA: Diagnosis not present

## 2021-03-26 DIAGNOSIS — J441 Chronic obstructive pulmonary disease with (acute) exacerbation: Secondary | ICD-10-CM | POA: Diagnosis not present

## 2021-03-26 DIAGNOSIS — Z6836 Body mass index (BMI) 36.0-36.9, adult: Secondary | ICD-10-CM | POA: Diagnosis not present

## 2021-04-28 DIAGNOSIS — I82890 Acute embolism and thrombosis of other specified veins: Secondary | ICD-10-CM | POA: Diagnosis not present

## 2021-04-29 DIAGNOSIS — I10 Essential (primary) hypertension: Secondary | ICD-10-CM | POA: Diagnosis not present

## 2021-04-29 DIAGNOSIS — E1142 Type 2 diabetes mellitus with diabetic polyneuropathy: Secondary | ICD-10-CM | POA: Diagnosis not present

## 2021-04-29 DIAGNOSIS — E782 Mixed hyperlipidemia: Secondary | ICD-10-CM | POA: Diagnosis not present

## 2021-04-29 DIAGNOSIS — I4891 Unspecified atrial fibrillation: Secondary | ICD-10-CM | POA: Diagnosis not present

## 2021-04-29 DIAGNOSIS — E7849 Other hyperlipidemia: Secondary | ICD-10-CM | POA: Diagnosis not present

## 2021-04-29 DIAGNOSIS — E8881 Metabolic syndrome: Secondary | ICD-10-CM | POA: Diagnosis not present

## 2021-05-05 DIAGNOSIS — E1122 Type 2 diabetes mellitus with diabetic chronic kidney disease: Secondary | ICD-10-CM | POA: Diagnosis not present

## 2021-05-05 DIAGNOSIS — I10 Essential (primary) hypertension: Secondary | ICD-10-CM | POA: Diagnosis not present

## 2021-05-05 DIAGNOSIS — I2782 Chronic pulmonary embolism: Secondary | ICD-10-CM | POA: Diagnosis not present

## 2021-05-05 DIAGNOSIS — J9691 Respiratory failure, unspecified with hypoxia: Secondary | ICD-10-CM | POA: Diagnosis not present

## 2021-05-05 DIAGNOSIS — I25119 Atherosclerotic heart disease of native coronary artery with unspecified angina pectoris: Secondary | ICD-10-CM | POA: Diagnosis not present

## 2021-05-05 DIAGNOSIS — E1142 Type 2 diabetes mellitus with diabetic polyneuropathy: Secondary | ICD-10-CM | POA: Diagnosis not present

## 2021-05-05 DIAGNOSIS — I5032 Chronic diastolic (congestive) heart failure: Secondary | ICD-10-CM | POA: Diagnosis not present

## 2021-05-05 DIAGNOSIS — E7849 Other hyperlipidemia: Secondary | ICD-10-CM | POA: Diagnosis not present

## 2021-06-02 DIAGNOSIS — I4891 Unspecified atrial fibrillation: Secondary | ICD-10-CM | POA: Diagnosis not present

## 2021-06-02 DIAGNOSIS — I82890 Acute embolism and thrombosis of other specified veins: Secondary | ICD-10-CM | POA: Diagnosis not present

## 2021-07-02 DIAGNOSIS — J209 Acute bronchitis, unspecified: Secondary | ICD-10-CM | POA: Diagnosis not present

## 2021-07-02 DIAGNOSIS — Z6835 Body mass index (BMI) 35.0-35.9, adult: Secondary | ICD-10-CM | POA: Diagnosis not present

## 2021-07-02 DIAGNOSIS — J0101 Acute recurrent maxillary sinusitis: Secondary | ICD-10-CM | POA: Diagnosis not present

## 2021-07-02 DIAGNOSIS — R030 Elevated blood-pressure reading, without diagnosis of hypertension: Secondary | ICD-10-CM | POA: Diagnosis not present

## 2021-07-14 DIAGNOSIS — I482 Chronic atrial fibrillation, unspecified: Secondary | ICD-10-CM | POA: Diagnosis not present

## 2021-07-14 DIAGNOSIS — Z86711 Personal history of pulmonary embolism: Secondary | ICD-10-CM | POA: Diagnosis not present

## 2021-07-26 DIAGNOSIS — Z1283 Encounter for screening for malignant neoplasm of skin: Secondary | ICD-10-CM | POA: Diagnosis not present

## 2021-07-26 DIAGNOSIS — D239 Other benign neoplasm of skin, unspecified: Secondary | ICD-10-CM | POA: Diagnosis not present

## 2021-07-26 DIAGNOSIS — L570 Actinic keratosis: Secondary | ICD-10-CM | POA: Diagnosis not present

## 2021-08-18 DIAGNOSIS — I4891 Unspecified atrial fibrillation: Secondary | ICD-10-CM | POA: Diagnosis not present

## 2021-08-18 DIAGNOSIS — I82890 Acute embolism and thrombosis of other specified veins: Secondary | ICD-10-CM | POA: Diagnosis not present

## 2021-09-01 DIAGNOSIS — E875 Hyperkalemia: Secondary | ICD-10-CM | POA: Diagnosis not present

## 2021-09-01 DIAGNOSIS — E1142 Type 2 diabetes mellitus with diabetic polyneuropathy: Secondary | ICD-10-CM | POA: Diagnosis not present

## 2021-09-01 DIAGNOSIS — E039 Hypothyroidism, unspecified: Secondary | ICD-10-CM | POA: Diagnosis not present

## 2021-09-01 DIAGNOSIS — R5383 Other fatigue: Secondary | ICD-10-CM | POA: Diagnosis not present

## 2021-09-01 DIAGNOSIS — E7849 Other hyperlipidemia: Secondary | ICD-10-CM | POA: Diagnosis not present

## 2021-09-08 DIAGNOSIS — E7849 Other hyperlipidemia: Secondary | ICD-10-CM | POA: Diagnosis not present

## 2021-09-08 DIAGNOSIS — E1142 Type 2 diabetes mellitus with diabetic polyneuropathy: Secondary | ICD-10-CM | POA: Diagnosis not present

## 2021-09-08 DIAGNOSIS — G4733 Obstructive sleep apnea (adult) (pediatric): Secondary | ICD-10-CM | POA: Diagnosis not present

## 2021-09-08 DIAGNOSIS — E039 Hypothyroidism, unspecified: Secondary | ICD-10-CM | POA: Diagnosis not present

## 2021-09-08 DIAGNOSIS — E1122 Type 2 diabetes mellitus with diabetic chronic kidney disease: Secondary | ICD-10-CM | POA: Diagnosis not present

## 2021-09-08 DIAGNOSIS — I25119 Atherosclerotic heart disease of native coronary artery with unspecified angina pectoris: Secondary | ICD-10-CM | POA: Diagnosis not present

## 2021-09-08 DIAGNOSIS — D692 Other nonthrombocytopenic purpura: Secondary | ICD-10-CM | POA: Diagnosis not present

## 2021-09-08 DIAGNOSIS — I5032 Chronic diastolic (congestive) heart failure: Secondary | ICD-10-CM | POA: Diagnosis not present

## 2021-09-08 DIAGNOSIS — J9691 Respiratory failure, unspecified with hypoxia: Secondary | ICD-10-CM | POA: Diagnosis not present

## 2021-09-08 DIAGNOSIS — I2782 Chronic pulmonary embolism: Secondary | ICD-10-CM | POA: Diagnosis not present

## 2021-09-08 DIAGNOSIS — I10 Essential (primary) hypertension: Secondary | ICD-10-CM | POA: Diagnosis not present

## 2021-09-08 DIAGNOSIS — E8881 Metabolic syndrome: Secondary | ICD-10-CM | POA: Diagnosis not present

## 2021-09-29 DIAGNOSIS — I8290 Acute embolism and thrombosis of unspecified vein: Secondary | ICD-10-CM | POA: Diagnosis not present

## 2021-09-29 DIAGNOSIS — I4891 Unspecified atrial fibrillation: Secondary | ICD-10-CM | POA: Diagnosis not present

## 2021-11-08 DIAGNOSIS — Z23 Encounter for immunization: Secondary | ICD-10-CM | POA: Diagnosis not present

## 2021-11-09 DIAGNOSIS — R030 Elevated blood-pressure reading, without diagnosis of hypertension: Secondary | ICD-10-CM | POA: Diagnosis not present

## 2021-11-09 DIAGNOSIS — I2782 Chronic pulmonary embolism: Secondary | ICD-10-CM | POA: Diagnosis not present

## 2021-11-09 DIAGNOSIS — I4891 Unspecified atrial fibrillation: Secondary | ICD-10-CM | POA: Diagnosis not present

## 2021-11-16 DIAGNOSIS — I4891 Unspecified atrial fibrillation: Secondary | ICD-10-CM | POA: Diagnosis not present

## 2021-11-16 DIAGNOSIS — I8290 Acute embolism and thrombosis of unspecified vein: Secondary | ICD-10-CM | POA: Diagnosis not present

## 2021-11-16 DIAGNOSIS — R030 Elevated blood-pressure reading, without diagnosis of hypertension: Secondary | ICD-10-CM | POA: Diagnosis not present

## 2021-12-21 DIAGNOSIS — R030 Elevated blood-pressure reading, without diagnosis of hypertension: Secondary | ICD-10-CM | POA: Diagnosis not present

## 2021-12-21 DIAGNOSIS — I8290 Acute embolism and thrombosis of unspecified vein: Secondary | ICD-10-CM | POA: Diagnosis not present

## 2021-12-21 DIAGNOSIS — I4891 Unspecified atrial fibrillation: Secondary | ICD-10-CM | POA: Diagnosis not present

## 2021-12-28 DIAGNOSIS — H35363 Drusen (degenerative) of macula, bilateral: Secondary | ICD-10-CM | POA: Diagnosis not present

## 2022-01-04 DIAGNOSIS — Z23 Encounter for immunization: Secondary | ICD-10-CM | POA: Diagnosis not present

## 2022-01-13 DIAGNOSIS — Z0001 Encounter for general adult medical examination with abnormal findings: Secondary | ICD-10-CM | POA: Diagnosis not present

## 2022-01-13 DIAGNOSIS — I2782 Chronic pulmonary embolism: Secondary | ICD-10-CM | POA: Diagnosis not present

## 2022-01-13 DIAGNOSIS — I10 Essential (primary) hypertension: Secondary | ICD-10-CM | POA: Diagnosis not present

## 2022-01-13 DIAGNOSIS — E039 Hypothyroidism, unspecified: Secondary | ICD-10-CM | POA: Diagnosis not present

## 2022-01-13 DIAGNOSIS — E1122 Type 2 diabetes mellitus with diabetic chronic kidney disease: Secondary | ICD-10-CM | POA: Diagnosis not present

## 2022-01-13 DIAGNOSIS — N1832 Chronic kidney disease, stage 3b: Secondary | ICD-10-CM | POA: Diagnosis not present

## 2022-01-13 DIAGNOSIS — I4891 Unspecified atrial fibrillation: Secondary | ICD-10-CM | POA: Diagnosis not present

## 2022-01-13 DIAGNOSIS — E1142 Type 2 diabetes mellitus with diabetic polyneuropathy: Secondary | ICD-10-CM | POA: Diagnosis not present

## 2022-01-13 DIAGNOSIS — I482 Chronic atrial fibrillation, unspecified: Secondary | ICD-10-CM | POA: Diagnosis not present

## 2022-01-13 DIAGNOSIS — D692 Other nonthrombocytopenic purpura: Secondary | ICD-10-CM | POA: Diagnosis not present

## 2022-01-13 DIAGNOSIS — I25119 Atherosclerotic heart disease of native coronary artery with unspecified angina pectoris: Secondary | ICD-10-CM | POA: Diagnosis not present

## 2022-01-13 DIAGNOSIS — F17210 Nicotine dependence, cigarettes, uncomplicated: Secondary | ICD-10-CM | POA: Diagnosis not present

## 2022-01-13 DIAGNOSIS — J9691 Respiratory failure, unspecified with hypoxia: Secondary | ICD-10-CM | POA: Diagnosis not present

## 2022-01-13 DIAGNOSIS — I5032 Chronic diastolic (congestive) heart failure: Secondary | ICD-10-CM | POA: Diagnosis not present

## 2022-01-13 DIAGNOSIS — E8881 Metabolic syndrome: Secondary | ICD-10-CM | POA: Diagnosis not present

## 2022-01-13 DIAGNOSIS — J441 Chronic obstructive pulmonary disease with (acute) exacerbation: Secondary | ICD-10-CM | POA: Diagnosis not present

## 2022-01-13 DIAGNOSIS — E7849 Other hyperlipidemia: Secondary | ICD-10-CM | POA: Diagnosis not present

## 2022-02-01 DIAGNOSIS — I8290 Acute embolism and thrombosis of unspecified vein: Secondary | ICD-10-CM | POA: Diagnosis not present

## 2022-02-01 DIAGNOSIS — R030 Elevated blood-pressure reading, without diagnosis of hypertension: Secondary | ICD-10-CM | POA: Diagnosis not present

## 2022-02-01 DIAGNOSIS — I4891 Unspecified atrial fibrillation: Secondary | ICD-10-CM | POA: Diagnosis not present

## 2022-03-15 DIAGNOSIS — I4891 Unspecified atrial fibrillation: Secondary | ICD-10-CM | POA: Diagnosis not present

## 2022-03-15 DIAGNOSIS — R030 Elevated blood-pressure reading, without diagnosis of hypertension: Secondary | ICD-10-CM | POA: Diagnosis not present

## 2022-04-26 DIAGNOSIS — R030 Elevated blood-pressure reading, without diagnosis of hypertension: Secondary | ICD-10-CM | POA: Diagnosis not present

## 2022-04-26 DIAGNOSIS — I4891 Unspecified atrial fibrillation: Secondary | ICD-10-CM | POA: Diagnosis not present

## 2022-04-27 DIAGNOSIS — N1832 Chronic kidney disease, stage 3b: Secondary | ICD-10-CM | POA: Diagnosis not present

## 2022-04-27 DIAGNOSIS — E875 Hyperkalemia: Secondary | ICD-10-CM | POA: Diagnosis not present

## 2022-04-27 DIAGNOSIS — E1142 Type 2 diabetes mellitus with diabetic polyneuropathy: Secondary | ICD-10-CM | POA: Diagnosis not present

## 2022-04-27 DIAGNOSIS — J440 Chronic obstructive pulmonary disease with acute lower respiratory infection: Secondary | ICD-10-CM | POA: Diagnosis not present

## 2022-04-27 DIAGNOSIS — E782 Mixed hyperlipidemia: Secondary | ICD-10-CM | POA: Diagnosis not present

## 2022-04-27 DIAGNOSIS — E1122 Type 2 diabetes mellitus with diabetic chronic kidney disease: Secondary | ICD-10-CM | POA: Diagnosis not present

## 2022-04-27 DIAGNOSIS — E7849 Other hyperlipidemia: Secondary | ICD-10-CM | POA: Diagnosis not present

## 2022-05-04 DIAGNOSIS — I2782 Chronic pulmonary embolism: Secondary | ICD-10-CM | POA: Diagnosis not present

## 2022-05-04 DIAGNOSIS — E8881 Metabolic syndrome: Secondary | ICD-10-CM | POA: Diagnosis not present

## 2022-05-04 DIAGNOSIS — E7849 Other hyperlipidemia: Secondary | ICD-10-CM | POA: Diagnosis not present

## 2022-05-04 DIAGNOSIS — J9691 Respiratory failure, unspecified with hypoxia: Secondary | ICD-10-CM | POA: Diagnosis not present

## 2022-05-04 DIAGNOSIS — I25119 Atherosclerotic heart disease of native coronary artery with unspecified angina pectoris: Secondary | ICD-10-CM | POA: Diagnosis not present

## 2022-05-04 DIAGNOSIS — E039 Hypothyroidism, unspecified: Secondary | ICD-10-CM | POA: Diagnosis not present

## 2022-05-04 DIAGNOSIS — I10 Essential (primary) hypertension: Secondary | ICD-10-CM | POA: Diagnosis not present

## 2022-05-04 DIAGNOSIS — D692 Other nonthrombocytopenic purpura: Secondary | ICD-10-CM | POA: Diagnosis not present

## 2022-05-04 DIAGNOSIS — E1142 Type 2 diabetes mellitus with diabetic polyneuropathy: Secondary | ICD-10-CM | POA: Diagnosis not present

## 2022-05-04 DIAGNOSIS — I5032 Chronic diastolic (congestive) heart failure: Secondary | ICD-10-CM | POA: Diagnosis not present

## 2022-05-04 DIAGNOSIS — I482 Chronic atrial fibrillation, unspecified: Secondary | ICD-10-CM | POA: Diagnosis not present

## 2022-05-04 DIAGNOSIS — E1122 Type 2 diabetes mellitus with diabetic chronic kidney disease: Secondary | ICD-10-CM | POA: Diagnosis not present

## 2022-06-07 DIAGNOSIS — I4891 Unspecified atrial fibrillation: Secondary | ICD-10-CM | POA: Diagnosis not present

## 2022-06-07 DIAGNOSIS — R030 Elevated blood-pressure reading, without diagnosis of hypertension: Secondary | ICD-10-CM | POA: Diagnosis not present

## 2022-06-18 DIAGNOSIS — M25511 Pain in right shoulder: Secondary | ICD-10-CM | POA: Diagnosis not present

## 2022-06-18 DIAGNOSIS — R030 Elevated blood-pressure reading, without diagnosis of hypertension: Secondary | ICD-10-CM | POA: Diagnosis not present

## 2022-06-18 DIAGNOSIS — Z6835 Body mass index (BMI) 35.0-35.9, adult: Secondary | ICD-10-CM | POA: Diagnosis not present

## 2022-07-19 DIAGNOSIS — D485 Neoplasm of uncertain behavior of skin: Secondary | ICD-10-CM | POA: Diagnosis not present

## 2022-07-19 DIAGNOSIS — L570 Actinic keratosis: Secondary | ICD-10-CM | POA: Diagnosis not present

## 2022-07-19 DIAGNOSIS — I4891 Unspecified atrial fibrillation: Secondary | ICD-10-CM | POA: Diagnosis not present

## 2022-07-19 DIAGNOSIS — R030 Elevated blood-pressure reading, without diagnosis of hypertension: Secondary | ICD-10-CM | POA: Diagnosis not present

## 2022-07-19 DIAGNOSIS — L723 Sebaceous cyst: Secondary | ICD-10-CM | POA: Diagnosis not present

## 2022-08-30 DIAGNOSIS — E1142 Type 2 diabetes mellitus with diabetic polyneuropathy: Secondary | ICD-10-CM | POA: Diagnosis not present

## 2022-08-30 DIAGNOSIS — E039 Hypothyroidism, unspecified: Secondary | ICD-10-CM | POA: Diagnosis not present

## 2022-08-30 DIAGNOSIS — E782 Mixed hyperlipidemia: Secondary | ICD-10-CM | POA: Diagnosis not present

## 2022-08-30 DIAGNOSIS — E7849 Other hyperlipidemia: Secondary | ICD-10-CM | POA: Diagnosis not present

## 2022-08-30 DIAGNOSIS — E875 Hyperkalemia: Secondary | ICD-10-CM | POA: Diagnosis not present

## 2022-08-30 DIAGNOSIS — E1122 Type 2 diabetes mellitus with diabetic chronic kidney disease: Secondary | ICD-10-CM | POA: Diagnosis not present

## 2022-08-31 DIAGNOSIS — I4891 Unspecified atrial fibrillation: Secondary | ICD-10-CM | POA: Diagnosis not present

## 2022-08-31 DIAGNOSIS — R030 Elevated blood-pressure reading, without diagnosis of hypertension: Secondary | ICD-10-CM | POA: Diagnosis not present

## 2022-09-05 DIAGNOSIS — E1142 Type 2 diabetes mellitus with diabetic polyneuropathy: Secondary | ICD-10-CM | POA: Diagnosis not present

## 2022-09-05 DIAGNOSIS — I2782 Chronic pulmonary embolism: Secondary | ICD-10-CM | POA: Diagnosis not present

## 2022-09-05 DIAGNOSIS — I10 Essential (primary) hypertension: Secondary | ICD-10-CM | POA: Diagnosis not present

## 2022-09-05 DIAGNOSIS — E8881 Metabolic syndrome: Secondary | ICD-10-CM | POA: Diagnosis not present

## 2022-09-05 DIAGNOSIS — Z23 Encounter for immunization: Secondary | ICD-10-CM | POA: Diagnosis not present

## 2022-09-05 DIAGNOSIS — E7849 Other hyperlipidemia: Secondary | ICD-10-CM | POA: Diagnosis not present

## 2022-09-05 DIAGNOSIS — D692 Other nonthrombocytopenic purpura: Secondary | ICD-10-CM | POA: Diagnosis not present

## 2022-09-05 DIAGNOSIS — I482 Chronic atrial fibrillation, unspecified: Secondary | ICD-10-CM | POA: Diagnosis not present

## 2022-09-05 DIAGNOSIS — E1122 Type 2 diabetes mellitus with diabetic chronic kidney disease: Secondary | ICD-10-CM | POA: Diagnosis not present

## 2022-09-05 DIAGNOSIS — I25119 Atherosclerotic heart disease of native coronary artery with unspecified angina pectoris: Secondary | ICD-10-CM | POA: Diagnosis not present

## 2022-09-05 DIAGNOSIS — J9611 Chronic respiratory failure with hypoxia: Secondary | ICD-10-CM | POA: Diagnosis not present

## 2022-09-05 DIAGNOSIS — I5032 Chronic diastolic (congestive) heart failure: Secondary | ICD-10-CM | POA: Diagnosis not present

## 2022-09-27 DIAGNOSIS — Z23 Encounter for immunization: Secondary | ICD-10-CM | POA: Diagnosis not present

## 2022-09-30 DIAGNOSIS — I4891 Unspecified atrial fibrillation: Secondary | ICD-10-CM | POA: Diagnosis not present

## 2022-09-30 DIAGNOSIS — R030 Elevated blood-pressure reading, without diagnosis of hypertension: Secondary | ICD-10-CM | POA: Diagnosis not present

## 2022-11-11 DIAGNOSIS — R030 Elevated blood-pressure reading, without diagnosis of hypertension: Secondary | ICD-10-CM | POA: Diagnosis not present

## 2022-11-11 DIAGNOSIS — I4891 Unspecified atrial fibrillation: Secondary | ICD-10-CM | POA: Diagnosis not present

## 2022-11-11 DIAGNOSIS — I8290 Acute embolism and thrombosis of unspecified vein: Secondary | ICD-10-CM | POA: Diagnosis not present

## 2022-12-14 DIAGNOSIS — R030 Elevated blood-pressure reading, without diagnosis of hypertension: Secondary | ICD-10-CM | POA: Diagnosis not present

## 2022-12-14 DIAGNOSIS — I8290 Acute embolism and thrombosis of unspecified vein: Secondary | ICD-10-CM | POA: Diagnosis not present

## 2022-12-14 DIAGNOSIS — I4891 Unspecified atrial fibrillation: Secondary | ICD-10-CM | POA: Diagnosis not present

## 2022-12-29 DIAGNOSIS — H43393 Other vitreous opacities, bilateral: Secondary | ICD-10-CM | POA: Diagnosis not present

## 2023-01-09 DIAGNOSIS — E119 Type 2 diabetes mellitus without complications: Secondary | ICD-10-CM | POA: Diagnosis not present

## 2023-01-09 DIAGNOSIS — Z1321 Encounter for screening for nutritional disorder: Secondary | ICD-10-CM | POA: Diagnosis not present

## 2023-01-09 DIAGNOSIS — N1832 Chronic kidney disease, stage 3b: Secondary | ICD-10-CM | POA: Diagnosis not present

## 2023-01-09 DIAGNOSIS — Z0001 Encounter for general adult medical examination with abnormal findings: Secondary | ICD-10-CM | POA: Diagnosis not present

## 2023-01-09 DIAGNOSIS — E7849 Other hyperlipidemia: Secondary | ICD-10-CM | POA: Diagnosis not present

## 2023-01-09 DIAGNOSIS — E039 Hypothyroidism, unspecified: Secondary | ICD-10-CM | POA: Diagnosis not present

## 2023-01-09 DIAGNOSIS — E1122 Type 2 diabetes mellitus with diabetic chronic kidney disease: Secondary | ICD-10-CM | POA: Diagnosis not present

## 2023-01-16 DIAGNOSIS — D692 Other nonthrombocytopenic purpura: Secondary | ICD-10-CM | POA: Diagnosis not present

## 2023-01-16 DIAGNOSIS — Z0001 Encounter for general adult medical examination with abnormal findings: Secondary | ICD-10-CM | POA: Diagnosis not present

## 2023-01-16 DIAGNOSIS — J9611 Chronic respiratory failure with hypoxia: Secondary | ICD-10-CM | POA: Diagnosis not present

## 2023-01-16 DIAGNOSIS — E1122 Type 2 diabetes mellitus with diabetic chronic kidney disease: Secondary | ICD-10-CM | POA: Diagnosis not present

## 2023-01-16 DIAGNOSIS — I10 Essential (primary) hypertension: Secondary | ICD-10-CM | POA: Diagnosis not present

## 2023-01-16 DIAGNOSIS — I25119 Atherosclerotic heart disease of native coronary artery with unspecified angina pectoris: Secondary | ICD-10-CM | POA: Diagnosis not present

## 2023-01-16 DIAGNOSIS — E8881 Metabolic syndrome: Secondary | ICD-10-CM | POA: Diagnosis not present

## 2023-01-16 DIAGNOSIS — E7849 Other hyperlipidemia: Secondary | ICD-10-CM | POA: Diagnosis not present

## 2023-01-16 DIAGNOSIS — E1142 Type 2 diabetes mellitus with diabetic polyneuropathy: Secondary | ICD-10-CM | POA: Diagnosis not present

## 2023-01-16 DIAGNOSIS — I482 Chronic atrial fibrillation, unspecified: Secondary | ICD-10-CM | POA: Diagnosis not present

## 2023-01-16 DIAGNOSIS — I5032 Chronic diastolic (congestive) heart failure: Secondary | ICD-10-CM | POA: Diagnosis not present

## 2023-01-16 DIAGNOSIS — I2782 Chronic pulmonary embolism: Secondary | ICD-10-CM | POA: Diagnosis not present

## 2023-02-08 DIAGNOSIS — Z7901 Long term (current) use of anticoagulants: Secondary | ICD-10-CM | POA: Diagnosis not present

## 2023-03-22 DIAGNOSIS — Z7901 Long term (current) use of anticoagulants: Secondary | ICD-10-CM | POA: Diagnosis not present

## 2023-03-22 DIAGNOSIS — R030 Elevated blood-pressure reading, without diagnosis of hypertension: Secondary | ICD-10-CM | POA: Diagnosis not present

## 2023-04-14 DIAGNOSIS — J4550 Severe persistent asthma, uncomplicated: Secondary | ICD-10-CM | POA: Diagnosis not present

## 2023-04-14 DIAGNOSIS — Z6837 Body mass index (BMI) 37.0-37.9, adult: Secondary | ICD-10-CM | POA: Diagnosis not present

## 2023-04-14 DIAGNOSIS — Z2089 Contact with and (suspected) exposure to other communicable diseases: Secondary | ICD-10-CM | POA: Diagnosis not present

## 2023-04-14 DIAGNOSIS — Z20828 Contact with and (suspected) exposure to other viral communicable diseases: Secondary | ICD-10-CM | POA: Diagnosis not present

## 2023-04-14 DIAGNOSIS — I5032 Chronic diastolic (congestive) heart failure: Secondary | ICD-10-CM | POA: Diagnosis not present

## 2023-04-14 DIAGNOSIS — R0902 Hypoxemia: Secondary | ICD-10-CM | POA: Diagnosis not present

## 2023-04-26 DIAGNOSIS — I5032 Chronic diastolic (congestive) heart failure: Secondary | ICD-10-CM | POA: Diagnosis not present

## 2023-04-26 DIAGNOSIS — Z6837 Body mass index (BMI) 37.0-37.9, adult: Secondary | ICD-10-CM | POA: Diagnosis not present

## 2023-04-26 DIAGNOSIS — J440 Chronic obstructive pulmonary disease with acute lower respiratory infection: Secondary | ICD-10-CM | POA: Diagnosis not present

## 2023-04-26 DIAGNOSIS — J9611 Chronic respiratory failure with hypoxia: Secondary | ICD-10-CM | POA: Diagnosis not present

## 2023-05-29 DIAGNOSIS — I2782 Chronic pulmonary embolism: Secondary | ICD-10-CM | POA: Diagnosis not present

## 2023-05-29 DIAGNOSIS — Z7901 Long term (current) use of anticoagulants: Secondary | ICD-10-CM | POA: Diagnosis not present

## 2023-05-29 DIAGNOSIS — I82890 Acute embolism and thrombosis of other specified veins: Secondary | ICD-10-CM | POA: Diagnosis not present

## 2023-05-29 DIAGNOSIS — I4891 Unspecified atrial fibrillation: Secondary | ICD-10-CM | POA: Diagnosis not present

## 2023-07-18 DIAGNOSIS — L573 Poikiloderma of Civatte: Secondary | ICD-10-CM | POA: Diagnosis not present

## 2023-07-18 DIAGNOSIS — L814 Other melanin hyperpigmentation: Secondary | ICD-10-CM | POA: Diagnosis not present

## 2023-07-18 DIAGNOSIS — L821 Other seborrheic keratosis: Secondary | ICD-10-CM | POA: Diagnosis not present

## 2023-07-18 DIAGNOSIS — L570 Actinic keratosis: Secondary | ICD-10-CM | POA: Diagnosis not present

## 2023-09-01 DIAGNOSIS — I4891 Unspecified atrial fibrillation: Secondary | ICD-10-CM | POA: Diagnosis not present

## 2023-09-06 DIAGNOSIS — R300 Dysuria: Secondary | ICD-10-CM | POA: Diagnosis not present

## 2023-09-06 DIAGNOSIS — J9611 Chronic respiratory failure with hypoxia: Secondary | ICD-10-CM | POA: Diagnosis not present

## 2023-09-06 DIAGNOSIS — I2782 Chronic pulmonary embolism: Secondary | ICD-10-CM | POA: Diagnosis not present

## 2023-09-06 DIAGNOSIS — J440 Chronic obstructive pulmonary disease with acute lower respiratory infection: Secondary | ICD-10-CM | POA: Diagnosis not present

## 2023-09-06 DIAGNOSIS — I5032 Chronic diastolic (congestive) heart failure: Secondary | ICD-10-CM | POA: Diagnosis not present

## 2023-09-06 DIAGNOSIS — Z6838 Body mass index (BMI) 38.0-38.9, adult: Secondary | ICD-10-CM | POA: Diagnosis not present

## 2023-11-01 DIAGNOSIS — Z7901 Long term (current) use of anticoagulants: Secondary | ICD-10-CM | POA: Diagnosis not present

## 2023-11-08 DIAGNOSIS — E1122 Type 2 diabetes mellitus with diabetic chronic kidney disease: Secondary | ICD-10-CM | POA: Diagnosis not present

## 2023-11-08 DIAGNOSIS — E559 Vitamin D deficiency, unspecified: Secondary | ICD-10-CM | POA: Diagnosis not present

## 2023-11-08 DIAGNOSIS — E039 Hypothyroidism, unspecified: Secondary | ICD-10-CM | POA: Diagnosis not present

## 2023-11-08 DIAGNOSIS — R5383 Other fatigue: Secondary | ICD-10-CM | POA: Diagnosis not present

## 2023-11-08 DIAGNOSIS — E7849 Other hyperlipidemia: Secondary | ICD-10-CM | POA: Diagnosis not present

## 2023-11-08 DIAGNOSIS — E875 Hyperkalemia: Secondary | ICD-10-CM | POA: Diagnosis not present

## 2023-11-09 DIAGNOSIS — Z23 Encounter for immunization: Secondary | ICD-10-CM | POA: Diagnosis not present

## 2023-11-15 DIAGNOSIS — Z6836 Body mass index (BMI) 36.0-36.9, adult: Secondary | ICD-10-CM | POA: Diagnosis not present

## 2023-11-15 DIAGNOSIS — I2782 Chronic pulmonary embolism: Secondary | ICD-10-CM | POA: Diagnosis not present

## 2023-11-15 DIAGNOSIS — I25119 Atherosclerotic heart disease of native coronary artery with unspecified angina pectoris: Secondary | ICD-10-CM | POA: Diagnosis not present

## 2023-11-15 DIAGNOSIS — J9611 Chronic respiratory failure with hypoxia: Secondary | ICD-10-CM | POA: Diagnosis not present

## 2023-11-15 DIAGNOSIS — N1832 Chronic kidney disease, stage 3b: Secondary | ICD-10-CM | POA: Diagnosis not present

## 2023-11-15 DIAGNOSIS — E1122 Type 2 diabetes mellitus with diabetic chronic kidney disease: Secondary | ICD-10-CM | POA: Diagnosis not present

## 2023-11-15 DIAGNOSIS — E039 Hypothyroidism, unspecified: Secondary | ICD-10-CM | POA: Diagnosis not present

## 2023-11-15 DIAGNOSIS — G4733 Obstructive sleep apnea (adult) (pediatric): Secondary | ICD-10-CM | POA: Diagnosis not present

## 2023-11-15 DIAGNOSIS — J449 Chronic obstructive pulmonary disease, unspecified: Secondary | ICD-10-CM | POA: Diagnosis not present

## 2023-11-15 DIAGNOSIS — I482 Chronic atrial fibrillation, unspecified: Secondary | ICD-10-CM | POA: Diagnosis not present

## 2023-11-15 DIAGNOSIS — I5032 Chronic diastolic (congestive) heart failure: Secondary | ICD-10-CM | POA: Diagnosis not present

## 2023-11-15 DIAGNOSIS — E559 Vitamin D deficiency, unspecified: Secondary | ICD-10-CM | POA: Diagnosis not present

## 2023-12-25 DIAGNOSIS — Z6836 Body mass index (BMI) 36.0-36.9, adult: Secondary | ICD-10-CM | POA: Diagnosis not present

## 2023-12-25 DIAGNOSIS — Z0001 Encounter for general adult medical examination with abnormal findings: Secondary | ICD-10-CM | POA: Diagnosis not present

## 2023-12-25 DIAGNOSIS — Z1331 Encounter for screening for depression: Secondary | ICD-10-CM | POA: Diagnosis not present

## 2023-12-25 DIAGNOSIS — Z1389 Encounter for screening for other disorder: Secondary | ICD-10-CM | POA: Diagnosis not present

## 2023-12-27 DIAGNOSIS — Z7901 Long term (current) use of anticoagulants: Secondary | ICD-10-CM | POA: Diagnosis not present

## 2023-12-27 DIAGNOSIS — I482 Chronic atrial fibrillation, unspecified: Secondary | ICD-10-CM | POA: Diagnosis not present

## 2023-12-27 DIAGNOSIS — I82890 Acute embolism and thrombosis of other specified veins: Secondary | ICD-10-CM | POA: Diagnosis not present
